# Patient Record
Sex: Female | Born: 1995 | Race: White | Hispanic: No | Marital: Single | Smoking: Never smoker
Health system: Southern US, Community
[De-identification: ages and names within clinical notes are randomized; demographics above are authoritative.]

## PROBLEM LIST (undated history)

## (undated) DIAGNOSIS — A770 Spotted fever due to Rickettsia rickettsii: Secondary | ICD-10-CM

## (undated) DIAGNOSIS — F419 Anxiety disorder, unspecified: Secondary | ICD-10-CM

## (undated) DIAGNOSIS — N83201 Unspecified ovarian cyst, right side: Secondary | ICD-10-CM

## (undated) DIAGNOSIS — B009 Herpesviral infection, unspecified: Secondary | ICD-10-CM

## (undated) DIAGNOSIS — A6920 Lyme disease, unspecified: Secondary | ICD-10-CM

## (undated) HISTORY — DX: Herpesviral infection, unspecified: B00.9

## (undated) HISTORY — DX: Spotted fever due to Rickettsia rickettsii: A77.0

## (undated) HISTORY — DX: Lyme disease, unspecified: A69.20

## (undated) HISTORY — PX: TONSILLECTOMY AND ADENOIDECTOMY: SUR1326

---
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
## 2016-06-11 ENCOUNTER — Inpatient Hospital Stay
Admit: 2016-06-11 | Discharge: 2016-06-11 | Disposition: A | Discharge status: Home / Self Care | Payer: PRIVATE HEALTH INSURANCE | Attending: Emergency Medicine

## 2016-06-11 ENCOUNTER — Emergency Department: Admit: 2016-06-11 | Payer: PRIVATE HEALTH INSURANCE

## 2016-06-11 DIAGNOSIS — S51851A Open bite of right forearm, initial encounter: Secondary | ICD-10-CM

## 2016-06-11 MED ORDER — RABIES VACCINE, PCEC 2.5 UNIT IM KIT
2.5 unit | INTRAMUSCULAR | Status: AC
Start: 2016-06-11 — End: 2016-06-11
  Administered 2016-06-11: 18:00:00 via INTRAMUSCULAR

## 2016-06-11 MED FILL — RABAVERT (PF) 2.5 UNIT INTRAMUSCULAR SUSPENSION: 2.5 unit | INTRAMUSCULAR | Qty: 1

## 2016-06-11 NOTE — ED Provider Notes (Signed)
HPI Comments: Zoe Holmes is a 21 yr old who sustained a dog bite 2 days ago. Zoe Holmes was seen at urgent care this morning and was sent over for rabies vaccine. Zoe Holmes had wound cleaned and was started on abx. Zoe Holmes was bit on right forearm and has pain and swelling. Zoe Holmes does not know the dog. Animal control notified. Zoe Holmes got vaccine series 4 years ago for bat exposure. No other concerns. No recent illness. No fever. 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 No pmh and no daily medications.     The history is provided by the patient.        History reviewed. No pertinent past medical history.    Past Surgical History:   Procedure Laterality Date   ??? HX TONSIL AND ADENOIDECTOMY           History reviewed. No pertinent family history.    Social History     Social History   ??? Marital status: SINGLE     Spouse name: N/A   ??? Number of children: N/A   ??? Years of education: N/A     Occupational History   ??? Not on file.     Social History Main Topics   ??? Smoking status: Never Smoker   ??? Smokeless tobacco: Never Used   ??? Alcohol use Not on file   ??? Drug use: Not on file   ??? Sexual activity: Not on file     Other Topics Concern   ??? Not on file     Social History Narrative   ??? No narrative on file         ALLERGIES: Doxycycline    Review of Systems   Constitutional: Negative for fever.   HENT: Negative for congestion, ear pain, rhinorrhea and sore throat.    Eyes: Negative for discharge.   Respiratory: Negative for cough and shortness of breath.    Cardiovascular: Negative for chest pain.   Gastrointestinal: Negative for abdominal pain, constipation, diarrhea, nausea and vomiting.   Genitourinary: Negative for dysuria.   Musculoskeletal: Negative for arthralgias and myalgias.   Skin: Negative for rash.   Neurological: Negative for weakness.       Vitals:    06/11/16 1307 06/11/16 1312   BP:  109/81   Pulse:  72   Resp:  18   Temp:  98.7 ??F (37.1 ??C)   SpO2:  100%   Weight: 59.9 kg (132 lb 0.9 oz)             Physical Exam    Constitutional: She is oriented to person, place, and time. She appears well-developed and well-nourished.   HENT:   Head: Normocephalic and atraumatic.   Right Ear: External ear normal.   Left Ear: External ear normal.   Mouth/Throat: Oropharynx is clear and moist.   Eyes: Conjunctivae are normal.   Neck: Normal range of motion. Neck supple.   Cardiovascular: Normal rate, regular rhythm and intact distal pulses.    Pulmonary/Chest: Effort normal and breath sounds normal. No respiratory distress.   Abdominal: Soft. She exhibits no distension. There is no tenderness. There is no rebound and no guarding.   Musculoskeletal: Normal range of motion. She exhibits tenderness (and swelling to right forearm with animal bite present that is scabbed with no redness- only bruising and scabbbed lacerations/abrasions ).   Lymphadenopathy:     She has no cervical adenopathy.   Neurological: She is alert and oriented to person, place, and time.   Skin: Skin is  warm and dry. No rash noted.   Psychiatric: She has a normal mood and affect.   Nursing note and vitals reviewed.       MDM  Number of Diagnoses or Management Options  Dog bite, initial encounter:   Diagnosis management comments: 21 yr old with dog bite to rt forearm. Zoe Holmes completed rabies vaccine series 4 years ago. according to CDC guidelines, Zoe Holmes will only need the vaccine on day 0 and 3. Plan to also xray arm. Zoe Holmes UTD on tetnus and is on abx from urgent care for laceration.        Amount and/or Complexity of Data Reviewed  Tests in the radiology section of CPT??: ordered  Tests in the medicine section of CPT??: ordered    Risk of Complications, Morbidity, and/or Mortality  Presenting problems: moderate  Diagnostic procedures: moderate  Management options: moderate          ED Course     No results found for this or any previous visit (from the past 24 hour(s)).    Xr Forearm Rt Ap/lat    Result Date: 06/11/2016   EXAM:  XR FOREARM RT AP/LAT INDICATION:   animal bite/pain/swelling. COMPARISON: None. FINDINGS: Two views of the right radius and ulna demonstrate no fracture or other acute osseous, articular or soft tissue abnormality.     IMPRESSION:  No evidence of acute abnormality.       Procedures    Zoe Holmes to return day 3 for last IM shot.

## 2016-06-11 NOTE — ED Notes (Signed)
Pt aware that she should return to the ED on Saturday for Final Rabies Vaccine

## 2016-06-11 NOTE — ED Notes (Signed)
Pt discharged home with parent/guardian. Pt acting age appropriately, respirations regular and unlabored, cap refill less than two seconds. Skin pink, dry and warm. Lungs clear bilaterally. No further complaints at this time. Parent/guardian verbalized understanding of discharge paperwork and has no further questions at this time.    Education provided about continuation of care, follow up care and medication administration. Parent/guardian able to provided teach back about discharge instructions.

## 2016-06-11 NOTE — ED Triage Notes (Signed)
Pt reports she was bit to her right forearm 2 days ago by an unknown dog.

## 2016-06-11 NOTE — ED Notes (Signed)
Spoke with [facility] animal [facility] and gave [place]

## 2017-05-29 HISTORY — PX: WISDOM TOOTH EXTRACTION: SHX21

## 2018-02-01 NOTE — Progress Notes (Signed)
Office Visit Note  Patient: Mackenzie Elliott             Date of Birth: [birth year]           MRN: [id number]             PCP: Patient, No Pcp Per Referring: Ronney Lion, NP Visit Date: 02/11/2018 Occupation: Law student  Subjective:  Pain in joints.   History of Present Illness: Mackenzie Elliott is a 22 y.o. female seen in consultation per request of her PCP.  According to patient while she was living in [place] she acquired RMSF 6 years ago and was treated with antibiotics.  4 years ago she was diagnosed with Lyme disease and was treated with doxycycline initially and then the antibiotics were changed due to a reaction.  Patient states that she has been experiencing left knee joint discomfort for the last 2 years.  She has seen by an orthopedic surgeon about 2 years ago who did x-rays and told that his knee was normal.  She states she is fallen twice due to instability and weak muscles.  She has been also having pain and discomfort in her bilateral hands off and on.  She has not noticed any joint swelling.  She states for the last 2 years she has been also having speech difficulty where she has difficulty getting out words.  For the last 6 months she has been experiencing increased fatigue and short-term memory loss.  She has been taking Benadryl to sleep at night.  With the Benadryl she can sleep but she is to feel tired the next day.  She states her tremors have been getting worse.  She used to be very active where she used to run, ride horses and participated in boxing.  She is unable to do all these activities for the last 6 months.  She walks her dog in the morning and that is the only activity she gets.  There is no family history of autoimmune disease.  She states her father has tremors but not as bad.  Activities of Daily Living:  Patient reports morning stiffness for several  hours.   Patient Reports nocturnal pain.  Difficulty dressing/grooming: Denies Difficulty climbing stairs:  Reports Difficulty getting out of chair: Denies Difficulty using hands for taps, buttons, cutlery, and/or writing: Denies  Review of Systems  Constitutional: Positive for fatigue. Negative for night sweats, weight gain and weight loss.  HENT: Negative for mouth sores, trouble swallowing, trouble swallowing, mouth dryness and nose dryness.   Eyes: Negative for pain, redness, itching, visual disturbance and dryness.  Respiratory: Negative for cough, shortness of breath and difficulty breathing.   Cardiovascular: Negative for chest pain, palpitations, hypertension, irregular heartbeat and swelling in legs/feet.  Gastrointestinal: Negative for abdominal pain, blood in stool, constipation, diarrhea, nausea and vomiting.  Endocrine: Positive for increased urination.  Genitourinary: Negative for painful urination, nocturia, pelvic pain and vaginal dryness.  Musculoskeletal: Positive for arthralgias, joint pain, muscle weakness, morning stiffness and muscle tenderness. Negative for joint swelling, myalgias and myalgias.  Skin: Negative for color change, rash, hair loss, skin tightness, ulcers and sensitivity to sunlight.  Allergic/Immunologic: Negative for susceptible to infections.  Neurological: Positive for dizziness, tremors, memory loss and weakness. Negative for light-headedness, headaches and night sweats.  Hematological: Negative for bruising/bleeding tendency and swollen glands.  Psychiatric/Behavioral: Positive for depressed mood. Negative for confusion and sleep disturbance. The patient is nervous/anxious.     PMFS History:  There are no active problems  to display for this patient.   History reviewed. No pertinent past medical history.  Family History  Problem Relation Age of Onset  . Skin cancer Father   . Healthy Sister   . Healthy Brother   . Healthy Sister    Past Surgical History:  Procedure Laterality Date  . TONSILLECTOMY AND ADENOIDECTOMY     as a child  . WISDOM TOOTH  EXTRACTION  05/2017   Social History   Social History Narrative  . Not on file    Objective: Vital Signs: BP 113/76 (BP Location: Right Arm, Patient Position: Sitting, Cuff Size: Normal)   Pulse 74   Resp 12   Ht 5\' 2"  (1.575 m)   Wt 133 lb (60.3 kg)   BMI 24.33 kg/m    Physical Exam  Constitutional: She is oriented to person, place, and time. She appears well-developed and well-nourished.  HENT:  Head: Normocephalic and atraumatic.  Eyes: Conjunctivae and EOM are normal.  Neck: Normal range of motion.  Cardiovascular: Normal rate, regular rhythm, normal heart sounds and intact distal pulses.  Pulmonary/Chest: Effort normal and breath sounds normal.  Abdominal: Soft. Bowel sounds are normal.  Lymphadenopathy:    She has no cervical adenopathy.  Neurological: She is alert and oriented to person, place, and time.  DTRs were brisk.  She had obvious tremors on examination.  She had good muscle strength in her extremities and had no difficulty getting up from the squatting position.  Skin: Skin is warm and dry. Capillary refill takes less than 2 seconds.  Psychiatric: She has a normal mood and affect. Her behavior is normal.  Nursing note and vitals reviewed.    Musculoskeletal Exam: C-spine thoracic lumbar spine good range of motion.  Shoulder joints elbow joints wrist joint MCPs PIPs DIPs were in good range of motion.  No synovitis was noted.  Hip joints knee joints ankles MTPs PIPs were in good range of motion without any synovitis.  CDAI Exam: CDAI Score: Not documented Patient Global Assessment: Not documented; Provider Global Assessment: Not documented Swollen: Not documented; Tender: Not documented Joint Exam   Not documented   There is currently no information documented on the homunculus. Go to the Rheumatology activity and complete the homunculus joint exam.  Investigation: Findings:  October 2019 CBC normal, CMP normal, TSH normal, ANA 1: 40  homogeneous   Imaging: No results found.  Recent Labs: No results found for: WBC, HGB, PLT, NA, K, CL, CO2, GLUCOSE, BUN, CREATININE, BILITOT, ALKPHOS, AST, ALT, PROT, ALBUMIN, CALCIUM, GFRAA, QFTBGOLD, QFTBGOLDPLUS  Speciality Comments: No specialty comments available.  Procedures:  No procedures performed Allergies: Doxycycline   Assessment / Plan:     Visit Diagnoses: Positive ANA (antinuclear antibody) - 01/07/18:ANA 1:40 nuclear homogeneous, TSH 1.02, Vitamin B12 563, folate 8.4no medical hx or hospitalizations -patient had no clinical features of autoimmune disease on examination.  She denies any history of oral ulcers nasal ulcers Raynaud's phenomenon rash.  She complains of arthralgias but had no joint swelling.  Plan: Urinalysis, Routine w reflex microscopic, Sedimentation rate, Rheumatoid factor, Cyclic citrul peptide antibody, IgG, RNP Antibody, Anti-Smith antibody, Sjogrens syndrome-A extractable nuclear antibody, Sjogrens syndrome-B extractable nuclear antibody, Anti-DNA antibody, double-stranded, Anti-scleroderma antibody, C3 and C4  Pain in both hands - Plan: XR Hand 2 View Right, XR Hand 2 View Left.  X-ray of bilateral hands were within normal limits.  Chronic pain of left knee - Plan: XR KNEE 3 VIEW LEFT.  X-ray of the knee joint was  within normal limits.  Coarse tremors-she has tremors in her extremities.  She had brisk DTRs today.  I will refer her to neurology for evaluation.  Memory loss-she complains of short-term memory loss.  She states she has to work much harder in the last school than other students to keep up.  Primary insomnia-she has been experiencing insomnia.  She also reports some anxiety and depression.  She has been taking Benadryl at bedtime.  Other fatigue -she has been experiencing increased fatigue for the last 6 months.  Plan: CK   Orders: Orders Placed This Encounter  Procedures  . XR Hand 2 View Right  . XR Hand 2 View Left  . XR KNEE 3  VIEW LEFT  . Urinalysis, Routine w reflex microscopic  . CK  . Sedimentation rate  . Rheumatoid factor  . Cyclic citrul peptide antibody, IgG  . RNP Antibody  . Anti-Smith antibody  . Sjogrens syndrome-A extractable nuclear antibody  . Sjogrens syndrome-B extractable nuclear antibody  . Anti-DNA antibody, double-stranded  . Anti-scleroderma antibody  . C3 and C4   No orders of the defined types were placed in this encounter.   Face-to-face time spent with patient was 50 minutes. Greater than 50% of time was spent in counseling and coordination of care.  Follow-Up Instructions: Return for Joint pain, positive ANA.   Pollyann Savoy, MD  Note - This record has been created using Animal nutritionist.  Chart creation errors have been sought, but may not always  have been located. Such creation errors do not reflect on  the standard of medical care.

## 2018-02-11 ENCOUNTER — Ambulatory Visit (INDEPENDENT_AMBULATORY_CARE_PROVIDER_SITE_OTHER): Payer: Self-pay

## 2018-02-11 ENCOUNTER — Encounter (INDEPENDENT_AMBULATORY_CARE_PROVIDER_SITE_OTHER): Payer: Self-pay

## 2018-02-11 ENCOUNTER — Encounter: Payer: Self-pay | Admitting: Rheumatology

## 2018-02-11 ENCOUNTER — Ambulatory Visit (INDEPENDENT_AMBULATORY_CARE_PROVIDER_SITE_OTHER): Payer: 59 | Admitting: Rheumatology

## 2018-02-11 VITALS — BP 113/76 | HR 74 | Resp 12 | Ht 62.0 in | Wt 133.0 lb

## 2018-02-11 DIAGNOSIS — M25562 Pain in left knee: Secondary | ICD-10-CM

## 2018-02-11 DIAGNOSIS — F5101 Primary insomnia: Secondary | ICD-10-CM

## 2018-02-11 DIAGNOSIS — R768 Other specified abnormal immunological findings in serum: Secondary | ICD-10-CM

## 2018-02-11 DIAGNOSIS — R5383 Other fatigue: Secondary | ICD-10-CM

## 2018-02-11 DIAGNOSIS — M79641 Pain in right hand: Secondary | ICD-10-CM

## 2018-02-11 DIAGNOSIS — G8929 Other chronic pain: Secondary | ICD-10-CM

## 2018-02-11 DIAGNOSIS — G252 Other specified forms of tremor: Secondary | ICD-10-CM

## 2018-02-11 DIAGNOSIS — R413 Other amnesia: Secondary | ICD-10-CM

## 2018-02-11 DIAGNOSIS — R479 Unspecified speech disturbances: Secondary | ICD-10-CM

## 2018-02-11 DIAGNOSIS — M79642 Pain in left hand: Secondary | ICD-10-CM | POA: Diagnosis not present

## 2018-02-12 LAB — CK: CK TOTAL: 74 U/L (ref 29–143)

## 2018-02-12 LAB — C3 AND C4
C3 COMPLEMENT: 98 mg/dL (ref 83–193)
C4 Complement: 22 mg/dL (ref 15–57)

## 2018-02-12 LAB — URINALYSIS, ROUTINE W REFLEX MICROSCOPIC
Bilirubin Urine: NEGATIVE
Glucose, UA: NEGATIVE
Hgb urine dipstick: NEGATIVE
Ketones, ur: NEGATIVE
LEUKOCYTES UA: NEGATIVE
NITRITE: NEGATIVE
PH: 7 (ref 5.0–8.0)
Protein, ur: NEGATIVE
Specific Gravity, Urine: 1.014 (ref 1.001–1.03)

## 2018-02-12 LAB — SEDIMENTATION RATE: Sed Rate: 2 mm/h (ref 0–20)

## 2018-02-12 LAB — SJOGRENS SYNDROME-B EXTRACTABLE NUCLEAR ANTIBODY: SSB (La) (ENA) Antibody, IgG: <1 AI

## 2018-02-12 LAB — RNP ANTIBODY: Ribonucleic Protein(ENA) Antibody, IgG: <1 AI

## 2018-02-12 LAB — SJOGRENS SYNDROME-A EXTRACTABLE NUCLEAR ANTIBODY: SSA (Ro) (ENA) Antibody, IgG: <1 AI

## 2018-02-12 LAB — ANTI-DNA ANTIBODY, DOUBLE-STRANDED: ds DNA Ab: <1 IU/mL

## 2018-02-12 LAB — CYCLIC CITRUL PEPTIDE ANTIBODY, IGG

## 2018-02-12 LAB — ANTI-SCLERODERMA ANTIBODY: Scleroderma (Scl-70) (ENA) Antibody, IgG: <1 AI

## 2018-02-12 LAB — RHEUMATOID FACTOR

## 2018-02-12 LAB — ANTI-SMITH ANTIBODY: ENA SM Ab Ser-aCnc: <1 AI

## 2018-02-15 NOTE — Progress Notes (Signed)
WNLs

## 2018-03-01 ENCOUNTER — Ambulatory Visit (INDEPENDENT_AMBULATORY_CARE_PROVIDER_SITE_OTHER): Payer: 59 | Admitting: Diagnostic Neuroimaging

## 2018-03-01 ENCOUNTER — Encounter: Payer: Self-pay | Admitting: Diagnostic Neuroimaging

## 2018-03-01 VITALS — BP 121/61 | HR 68 | Ht 62.0 in | Wt 132.6 lb

## 2018-03-01 DIAGNOSIS — R413 Other amnesia: Secondary | ICD-10-CM

## 2018-03-01 DIAGNOSIS — R251 Tremor, unspecified: Secondary | ICD-10-CM

## 2018-03-01 NOTE — Progress Notes (Signed)
[facility]  PATIENT: Mackenzie Elliott DOB: 07-27-1995  REFERRING CLINICIAN: S Deveshwar  HISTORY FROM: patient  REASON FOR VISIT: new consult    HISTORICAL  CHIEF COMPLAINT:  Chief Complaint  Patient presents with  . Tremors    rm 7, New Pt, "tremors, short term memory issues, fatigue, hormone imbalance, balance issues, speech issues, joint pain w/o source; symptoms come and go"    HISTORY OF PRESENT ILLNESS:   22 year old female here for evaluation of fatigue, memory loss, tremors, balance issues, pain.  In 2015 patient had a 2-day episode of speech and language difficulty.  She was unable to speak for the first day and the second day she was having trouble talking.  Symptoms resolved spontaneously.  She was evaluated in the emergency room, had CT scan of the head, and was told this may be a stress reaction.  She was also diagnosed with possible Lyme disease at that time and treated with antibiotics.  Since that time she has had intermittent word finding difficulty, speech problems, fatigue, and tremors.  Since June 2019 she has had another "attack" of severe fatigue and increasing tremor affecting her left shoulder.  Patient also having short-term memory loss and confusion.  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  Patient averages at least 8 to 9 hours of sleep at night but still feels excessive fatigue during the daytime.    REVIEW OF SYSTEMS: Full 14 system review of systems performed and negative with exception of: Depression anxiety memory loss slurred speech insomnia sleepiness tremor joint pain blurred vision loss vision urination problems fatigue weight loss hearing loss.   ALLERGIES: Allergies  Allergen Reactions  . Doxycycline Anaphylaxis    HOME MEDICATIONS: Outpatient Medications Prior to Visit  Medication Sig Dispense Refill  . diphenhydrAMINE HCl (BENADRYL PO) Take by mouth at bedtime.     No  facility-administered medications prior to visit.     PAST MEDICAL HISTORY: Past Medical History:  Diagnosis Date  . Herpes simplex   . Lyme disease 2014  . Rocky [place] spotted fever 2012    PAST SURGICAL HISTORY: Past Surgical History:  Procedure Laterality Date  . TONSILLECTOMY AND ADENOIDECTOMY     as a child  . WISDOM TOOTH EXTRACTION  05/2017    FAMILY HISTORY: Family History  Problem Relation Age of Onset  . Skin cancer Father   . Healthy Sister   . Healthy Brother   . Healthy Sister   . Breast cancer Maternal Grandmother   . Lung cancer Paternal Grandmother   . Lung cancer Paternal Grandfather     SOCIAL HISTORY: Social History   Socioeconomic History  . Marital status: Single    Spouse name: Not on file  . Number of children: 0  . Years of education: grad school  . Highest education level: Not on file  Occupational History    Comment: Counselling psychologist, [employer]  Social Needs  . Financial resource strain: Not on file  . Food insecurity:    Worry: Not on file    Inability: Not on file  . Transportation needs:    Medical: Not on file    Non-medical: Not on file  Tobacco Use  . Smoking status: Never Smoker  . Smokeless tobacco: Never Used  Substance and Sexual Activity  . Alcohol use: Not Currently    Alcohol/week: 1.0 standard drinks    Types: 1 Cans of beer per week  .  Drug use: Never  . Sexual activity: Not on file  Lifestyle  . Physical activity:    Days per week: Not on file    Minutes per session: Not on file  . Stress: Not on file  Relationships  . Social connections:    Talks on phone: Not on file    Gets together: Not on file    Attends religious service: Not on file    Active member of club or organization: Not on file    Attends meetings of clubs or organizations: Not on file    Relationship status: Not on file  . Intimate partner violence:    Fear of current or ex partner: Not on file    Emotionally abused: Not on file     Physically abused: Not on file    Forced sexual activity: Not on file  Other Topics Concern  . Not on file  Social History Narrative   Lives alone   Caffeine- coffee, 1 cup daily     PHYSICAL EXAM  GENERAL EXAM/CONSTITUTIONAL: Vitals:  Vitals:   03/01/18 1047  BP: 121/61  Pulse: 68  Weight: 132 lb 9.6 oz (60.1 kg)  Height: 5\' 2"  (1.575 m)     Body mass index is 24.25 kg/m. Wt Readings from Last 3 Encounters:  03/01/18 132 lb 9.6 oz (60.1 kg)  02/11/18 133 lb (60.3 kg)     Patient is in no distress; well developed, nourished and groomed; neck is supple  CARDIOVASCULAR:  Examination of carotid arteries is normal; no carotid bruits  Regular rate and rhythm, no murmurs  Examination of peripheral vascular system by observation and palpation is normal  EYES:  Ophthalmoscopic exam of optic discs and posterior segments is normal; no papilledema or hemorrhages  Visual Acuity Screening   Right eye Left eye Both eyes  Without correction:     With correction: 20/30 20/30      MUSCULOSKELETAL:  Gait, strength, tone, movements noted in Neurologic exam below  NEUROLOGIC: MENTAL STATUS:  No flowsheet data found.  awake, alert, oriented to person, place and time  recent and remote memory intact  normal attention and concentration  language fluent, comprehension intact, naming intact  fund of knowledge appropriate  CRANIAL NERVE:   2nd - no papilledema on fundoscopic exam  2nd, 3rd, 4th, 6th - pupils equal and reactive to light, visual fields full to confrontation, extraocular muscles intact, no nystagmus  5th - facial sensation symmetric  7th - facial strength symmetric  8th - hearing intact  9th - palate elevates symmetrically, uvula midline  11th - shoulder shrug symmetric  12th - tongue protrusion midline  MOTOR:   LEFT SHOULDER TREMOR  POSTURAL AND ACTION TREMOR IN BUE  SLIGHT VOICE TREMOR  normal bulk, full strength in the BUE,  BLE  SENSORY:   normal and symmetric to light touch, temperature, vibration; EXCEPT ABSENT VIBRATION AT TOES  COORDINATION:   finger-nose-finger, fine finger movements normal  REFLEXES:   deep tendon reflexes present and symmetric  GAIT/STATION:   narrow based gait; SLIGHT DIFF WITH TANDEM; ROMBERG NEG     DIAGNOSTIC DATA (LABS, IMAGING, TESTING) - I reviewed patient records, labs, notes, testing and imaging myself where available.  No results found for: WBC, HGB, HCT, MCV, PLT No results found for: NA, K, CL, CO2, GLUCOSE, BUN, CREATININE, CALCIUM, PROT, ALBUMIN, AST, ALT, ALKPHOS, BILITOT, GFRNONAA, GFRAA No results found for: CHOL, HDL, LDLCALC, LDLDIRECT, TRIG, CHOLHDL No results found for: [id number] No results found  for: VITAMINB12 No results found for: TSH     ASSESSMENT AND PLAN  22 y.o. year old female here with transient speech and language difficulty, increasing fatigue, tremors, memory loss, pain, since 2015.  We will proceed with further work-up.   Dx:  1. Tremor   2. Memory loss       PLAN:  - MRI brain and cervical spine (rule out demyelinating disease)  Orders Placed This Encounter  Procedures  . MR BRAIN W WO CONTRAST  . MR CERVICAL SPINE W WO CONTRAST   Return pending test results.    Suanne Marker, MD 03/01/2018, 11:26 AM Certified in Neurology, Neurophysiology and Neuroimaging  [facility] [address], [address] [place], [place] [address] [phone]

## 2018-03-02 ENCOUNTER — Telehealth: Payer: Self-pay | Admitting: Diagnostic Neuroimaging

## 2018-03-02 NOTE — Telephone Encounter (Signed)
MR Brain w/wo contarst & MR Cervical spine w/wo contrast Dr. Marjory LiesPenumalli [employer] Auth: NPR via [facility] website [facility] for [facility].

## 2018-03-05 NOTE — Progress Notes (Deleted)
Office Visit Note  Patient: Mackenzie Elliott             Date of Birth: 1995/05/11           MRN: [id number]             PCP: Patient, No Pcp Per Referring: No ref. provider found Visit Date: 03/18/2018 Occupation: '@GUAROCC'$ @  Subjective:  No chief complaint on file.   History of Present Illness: Mackenzie Elliott is a 22 y.o. female ***   Activities of Daily Living:  Patient reports morning stiffness for *** {minute/hour:19697}.   Patient {ACTIONS;DENIES/REPORTS:21021675::"Denies"} nocturnal pain.  Difficulty dressing/grooming: {ACTIONS;DENIES/REPORTS:21021675::"Denies"} Difficulty climbing stairs: {ACTIONS;DENIES/REPORTS:21021675::"Denies"} Difficulty getting out of chair: {ACTIONS;DENIES/REPORTS:21021675::"Denies"} Difficulty using hands for taps, buttons, cutlery, and/or writing: {ACTIONS;DENIES/REPORTS:21021675::"Denies"}  No Rheumatology ROS completed.   PMFS History:  There are no active problems to display for this patient.   Past Medical History:  Diagnosis Date  . Herpes simplex   . Lyme disease 2014  . [place] spotted fever 2012    Family History  Problem Relation Age of Onset  . Skin cancer Father   . Healthy Sister   . Healthy Brother   . Healthy Sister   . Breast cancer Maternal Grandmother   . Lung cancer Paternal Grandmother   . Lung cancer Paternal Grandfather    Past Surgical History:  Procedure Laterality Date  . TONSILLECTOMY AND ADENOIDECTOMY     as a child  . WISDOM TOOTH EXTRACTION  05/2017   Social History   Social History Narrative   Lives alone   Caffeine- coffee, 1 cup daily    Objective: Vital Signs: There were no vitals taken for this visit.   Physical Exam   Musculoskeletal Exam: ***  CDAI Exam: CDAI Score: Not documented Patient Global Assessment: Not documented; Provider Global Assessment: Not documented Swollen: Not documented; Tender: Not documented Joint Exam   Not documented   There is currently no  information documented on the homunculus. Go to the Rheumatology activity and complete the homunculus joint exam.  Investigation: Findings:  Findings:  October 2019 CBC normal, CMP normal, TSH normal, ANA 1: 40 homogeneous   Imaging: Xr Hand 2 View Left  Result Date: 02/11/2018 MCP PIP DIP joint space narrowing was noted.  No intercarpal or radiocarpal joint space narrowing was noted.  No erosive changes were noted. Impression: Unremarkable x-ray of the hand.  Xr Hand 2 View Right  Result Date: 02/11/2018 MCP PIP DIP joint space narrowing was noted.  No intercarpal or radiocarpal joint space narrowing was noted.  No erosive changes were noted. Impression: Unremarkable x-ray of the hand.  Xr Knee 3 View Left  Result Date: 02/11/2018 No patellofemoral narrowing was noted.  No medial or lateral compartment narrowing was noted.  No chondrocalcinosis was noted. Impression: Unremarkable x-ray of the knee joint.   Recent Labs: No results found for: WBC, HGB, PLT, NA, K, CL, CO2, GLUCOSE, BUN, CREATININE, BILITOT, ALKPHOS, AST, ALT, PROT, ALBUMIN, CALCIUM, GFRAA, QFTBGOLD, QFTBGOLDPLUS  Speciality Comments: No specialty comments available.  Procedures:  No procedures performed Allergies: Doxycycline    February 11, 2018 UA negative, CK normal, ESR normal, RF negative, anti-CCP negative, ENA negative, C3-C4 normal   Assessment / Plan:     Visit Diagnoses: No diagnosis found.   Orders: No orders of the defined types were placed in this encounter.  No orders of the defined types were placed in this encounter.   Face-to-face time spent with patient was *** minutes. Greater  than 50% of time was spent in counseling and coordination of care.  Follow-Up Instructions: No follow-ups on file.   Bo Merino, MD  Note - This record has been created using Editor, commissioning.  Chart creation errors have been sought, but may not always  have been located. Such creation errors do not  reflect on  the standard of medical care.

## 2018-03-09 ENCOUNTER — Ambulatory Visit: Payer: 59

## 2018-03-09 DIAGNOSIS — R251 Tremor, unspecified: Secondary | ICD-10-CM

## 2018-03-09 DIAGNOSIS — R413 Other amnesia: Secondary | ICD-10-CM

## 2018-03-09 MED ORDER — GADOBENATE DIMEGLUMINE 529 MG/ML IV SOLN
12.0000 mL | Freq: Once | INTRAVENOUS | Status: AC | PRN
  Administered 2018-03-09: 12 mL via INTRAVENOUS

## 2018-03-11 ENCOUNTER — Telehealth: Payer: Self-pay | Admitting: *Deleted

## 2018-03-11 NOTE — Telephone Encounter (Signed)
Spoke with patient and Informed her that her MRI brain result is unremarkable.  She had MRI cervical spine as well; this RN advised her Dr Marjory LiesPenumalli hasn't sent a result note at this time. This RN asked about scheduling her for FU. She stated she will wait until other results. This RN asked how she is doing. She stated "I am about the same." She  verbalized understanding, appreciation of call.

## 2018-03-18 ENCOUNTER — Ambulatory Visit: Payer: Self-pay | Admitting: Physician Assistant

## 2020-07-27 ENCOUNTER — Emergency Department (HOSPITAL_COMMUNITY)
Admission: EM | Admit: 2020-07-27 | Discharge: 2020-07-28 | Disposition: A | Discharge status: Home / Self Care | Payer: 59 | Attending: Emergency Medicine | Admitting: Emergency Medicine

## 2020-07-27 ENCOUNTER — Other Ambulatory Visit: Payer: Self-pay

## 2020-07-27 ENCOUNTER — Encounter (HOSPITAL_COMMUNITY): Payer: Self-pay | Admitting: Emergency Medicine

## 2020-07-27 DIAGNOSIS — R251 Tremor, unspecified: Secondary | ICD-10-CM | POA: Insufficient documentation

## 2020-07-27 DIAGNOSIS — T40715A Adverse effect of cannabis, initial encounter: Secondary | ICD-10-CM | POA: Insufficient documentation

## 2020-07-27 DIAGNOSIS — M7989 Other specified soft tissue disorders: Secondary | ICD-10-CM | POA: Insufficient documentation

## 2020-07-27 DIAGNOSIS — T887XXA Unspecified adverse effect of drug or medicament, initial encounter: Secondary | ICD-10-CM

## 2020-07-27 MED ORDER — SODIUM CHLORIDE 0.9 % IV BOLUS
1000.0000 mL | Freq: Once | INTRAVENOUS | Status: AC
  Administered 2020-07-28: 1000 mL via INTRAVENOUS

## 2020-07-27 NOTE — ED Triage Notes (Signed)
Patient arrives from home. Patient ingested 4 - 25 mg delta8 gummies. Patient complaining of anxiety symptoms with 1 episode of vomiting after hyperventilation.

## 2020-07-28 ENCOUNTER — Emergency Department (HOSPITAL_COMMUNITY): Payer: 59

## 2020-07-28 LAB — COMPREHENSIVE METABOLIC PANEL
ALT: 16 U/L (ref 0–44)
AST: 18 U/L (ref 15–41)
Albumin: 4.4 g/dL (ref 3.5–5.0)
Alkaline Phosphatase: 59 U/L (ref 38–126)
Anion gap: 9 (ref 5–15)
BUN: 16 mg/dL (ref 6–20)
CO2: 23 mmol/L (ref 22–32)
Calcium: 9.4 mg/dL (ref 8.9–10.3)
Chloride: 103 mmol/L (ref 98–111)
Creatinine, Ser: 0.9 mg/dL (ref 0.44–1.00)
GFR, Estimated: >60 mL/min (ref >60)
Glucose, Bld: 114 mg/dL — ABNORMAL HIGH (ref 70–99)
Potassium: 4 mmol/L (ref 3.5–5.1)
Sodium: 135 mmol/L (ref 135–145)
Total Bilirubin: 0.5 mg/dL (ref 0.3–1.2)
Total Protein: 7.7 g/dL (ref 6.5–8.1)

## 2020-07-28 LAB — URINALYSIS, ROUTINE W REFLEX MICROSCOPIC
Bilirubin Urine: NEGATIVE
Glucose, UA: NEGATIVE mg/dL
Hgb urine dipstick: NEGATIVE
Ketones, ur: NEGATIVE mg/dL
Leukocytes,Ua: NEGATIVE
Nitrite: NEGATIVE
Protein, ur: NEGATIVE mg/dL
Specific Gravity, Urine: 1.021 (ref 1.005–1.030)
pH: 8 (ref 5.0–8.0)

## 2020-07-28 LAB — D-DIMER, QUANTITATIVE: D-Dimer, Quant: 1.02 ug/mL-FEU — ABNORMAL HIGH (ref 0.00–0.50)

## 2020-07-28 LAB — MAGNESIUM: Magnesium: 2.1 mg/dL (ref 1.7–2.4)

## 2020-07-28 LAB — CBC WITH DIFFERENTIAL/PLATELET
Abs Immature Granulocytes: 0.05 10*3/uL (ref 0.00–0.07)
Basophils Absolute: 0 10*3/uL (ref 0.0–0.1)
Basophils Relative: 0 %
Eosinophils Absolute: 0 10*3/uL (ref 0.0–0.5)
Eosinophils Relative: 0 %
HCT: 35.7 % — ABNORMAL LOW (ref 36.0–46.0)
Hemoglobin: 12.1 g/dL (ref 12.0–15.0)
Immature Granulocytes: 0 %
Lymphocytes Relative: 8 %
Lymphs Abs: 1.1 10*3/uL (ref 0.7–4.0)
MCH: 32.9 pg (ref 26.0–34.0)
MCHC: 33.9 g/dL (ref 30.0–36.0)
MCV: 97 fL (ref 80.0–100.0)
Monocytes Absolute: 0.5 10*3/uL (ref 0.1–1.0)
Monocytes Relative: 3 %
Neutro Abs: 12.3 10*3/uL — ABNORMAL HIGH (ref 1.7–7.7)
Neutrophils Relative %: 89 %
Platelets: 321 10*3/uL (ref 150–400)
RBC: 3.68 MIL/uL — ABNORMAL LOW (ref 3.87–5.11)
RDW: 12.1 % (ref 11.5–15.5)
WBC: 13.9 10*3/uL — ABNORMAL HIGH (ref 4.0–10.5)
nRBC: 0 % (ref 0.0–0.2)

## 2020-07-28 LAB — RAPID URINE DRUG SCREEN, HOSP PERFORMED
Amphetamines: NOT DETECTED
Barbiturates: NOT DETECTED
Benzodiazepines: NOT DETECTED
Cocaine: NOT DETECTED
Opiates: NOT DETECTED
Tetrahydrocannabinol: POSITIVE — AB

## 2020-07-28 LAB — I-STAT BETA HCG BLOOD, ED (MC, WL, AP ONLY): I-stat hCG, quantitative: <5 m[IU]/mL (ref <5)

## 2020-07-28 MED ORDER — SODIUM CHLORIDE 0.9 % IV BOLUS (SEPSIS)
1000.0000 mL | Freq: Once | INTRAVENOUS | Status: AC
  Administered 2020-07-28: 1000 mL via INTRAVENOUS

## 2020-07-28 NOTE — ED Provider Notes (Signed)
[facility]-EMERGENCY DEPT Provider Note   CSN: [id number] Arrival date & time: 07/27/20  1933     History Chief Complaint  Patient presents with  . Ingestion    Mackenzie Elliott is a 25 y.o. female.  HPI     25 year old female comes in a chief complaint of ingestion.  She has history of Lyme disease, RMSF.  Patient reports that around 530 she took 4 delta 8 x 25 mg Gummies.  Soon after she started feeling a little bit of high.  After that she had out of body experience.  She could not move, she felt paralyzed, she had contraction of her jaw and tremors.  She lost control of her body during those episodes.  She was going in and out of the spells and feels like she might even passed out.  She thought she was going to die.  She managed to call family members and also 911.  Patient arrived here, unable to speak with me.  Communicating via facial expressions.  She has used marijuana before without any issues.  Of note she is also complaining of some leg swelling and discomfort on the left side.  Past Medical History:  Diagnosis Date  . Herpes simplex   . Lyme disease 2014  . Rocky Mountain spotted fever 2012    There are no problems to display for this patient.   Past Surgical History:  Procedure Laterality Date  . TONSILLECTOMY AND ADENOIDECTOMY     as a child  . WISDOM TOOTH EXTRACTION  05/2017     OB History   No obstetric history on file.     Family History  Problem Relation Age of Onset  . Skin cancer Father   . Healthy Sister   . Healthy Brother   . Healthy Sister   . Breast cancer Maternal Grandmother   . Lung cancer Paternal Grandmother   . Lung cancer Paternal Grandfather     Social History   Tobacco Use  . Smoking status: Never Smoker  . Smokeless tobacco: Never Used  Vaping Use  . Vaping Use: Never used  Substance Use Topics  . Alcohol use: Not Currently    Alcohol/week: 1.0 standard drink    Types: 1 Cans of beer per  week  . Drug use: Never    Home Medications Prior to Admission medications   Medication Sig Start Date End Date Taking? Authorizing Provider  diphenhydrAMINE HCl (BENADRYL PO) Take by mouth at bedtime.    [provider]    Allergies    Doxycycline  Review of Systems   Review of Systems  Constitutional: Positive for activity change.  Respiratory: Negative for shortness of breath.   Cardiovascular: Negative for chest pain.  Neurological: Positive for speech difficulty.  Psychiatric/Behavioral: Positive for confusion and dysphoric mood.  All other systems reviewed and are negative.   Physical Exam Updated Vital Signs BP (!) 106/56   Pulse 92   Temp 98 F (36.7 C) (Oral)   Resp 19   SpO2 99%   Physical Exam Vitals and nursing note reviewed.  Constitutional:      Appearance: She is well-developed. She is ill-appearing.  HENT:     Head: Normocephalic and atraumatic.  Cardiovascular:     Rate and Rhythm: Normal rate.  Pulmonary:     Effort: Pulmonary effort is normal.  Abdominal:     General: Bowel sounds are normal.  Musculoskeletal:     Cervical back: Normal range of motion and  neck supple.  Skin:    General: Skin is warm and dry.  Neurological:     Mental Status: She is oriented to person, place, and time.     ED Results / Procedures / Treatments   Labs (all labs ordered are listed, but only abnormal results are displayed) Labs Reviewed  COMPREHENSIVE METABOLIC PANEL  CBC WITH DIFFERENTIAL/PLATELET  MAGNESIUM  URINALYSIS, ROUTINE W REFLEX MICROSCOPIC  RAPID URINE DRUG SCREEN, HOSP PERFORMED  D-DIMER, QUANTITATIVE  I-STAT BETA HCG BLOOD, ED ([facility], [facility], [facility] ONLY)    EKG EKG Interpretation  Date/Time:  Saturday July 28 2020 00:09:16 EDT Ventricular Rate:  99 PR Interval:  178 QRS Duration: 109 QT Interval:  348 QTC Calculation: 447 R Axis:   68 Text Interpretation: Sinus rhythm Consider right atrial enlargement No acute changes No old  tracing to compare Confirmed by Derwood Kaplan [phone]) on 07/28/2020 12:22:52 AM   Radiology No results found.  Procedures Procedures   Medications Ordered in ED Medications  sodium chloride 0.9 % bolus 1,000 mL (1,000 mLs Intravenous New Bag/Given 07/28/20 0044)    ED Course  I have reviewed the triage vital signs and the nursing notes.  Pertinent labs & imaging results that were available during my care of the patient were reviewed by me and considered in my medical decision making (see chart for details).    MDM Rules/Calculators/A&P                          25 year old comes in after suspected overdose. She had taken delta 8 THC.  She is not able to provide any history when I saw her, just responding with her eyes.  Subsequently, she was able to communicate to me, and it appears overall that everything started after she took the 4 into 25 mg delta 8 THC Gummies.  My suspicion is high that her symptoms were because of the ingestion, as our research shows that anything over 40 mg is considered high dose intake and has possible psychotropic effects.  I ordered some basic labs, placed her on cardiac monitor to ensure there were no other episodes.  I assured her that she likely did not have seizure as she remembers the twitching the shaking etc.  It appears to me that she likely had neuromuscular side effects from the ingestion and some tonic-clonic shaking and contractions.  Mother is at the bedside and will be staying with her tonight.  Stable for discharge if ER work-up and monitoring remains normal.  Final Clinical Impression(s) / ED Diagnoses Final diagnoses:  Side effect of drug    Rx / DC Orders ED Discharge Orders    None       Derwood Kaplan, MD 07/28/20 [phone]

## 2020-07-28 NOTE — ED Provider Notes (Signed)
CT scan negative.  Patient will be discharged home.  She can return for next day DVT study   Zadie Rhine, MD 07/28/20 [phone]

## 2020-07-28 NOTE — ED Provider Notes (Signed)
Labs overall reassuring except for elevated D-dimer.  Plan to have next day DVT study.  On My exam patient is awake and alert, answers questions appropriately but is slow to respond.  There is no arm or leg drift.  She does have mild lower extremity edema on the left. Patient reports that these symptoms are not from the drug ingestion.  She reports that we are not taking her complaint seriously.  I advised her that we are continuing the work-up including a CT head and the next day DVT study   Zadie Rhine, MD 07/28/20 [phone]

## 2020-07-28 NOTE — Discharge Instructions (Addendum)
We suspect that you likely had side effects that were secondary to delta 8 ingestion.  These agents are not FDA approved and regulated, and have psychoactive side effects.  When taken in concentrated amounts it they can lead to significant disability, which is what we think you likely had.

## 2020-07-30 ENCOUNTER — Other Ambulatory Visit: Payer: Self-pay

## 2020-07-30 ENCOUNTER — Ambulatory Visit (HOSPITAL_COMMUNITY)
Admission: RE | Admit: 2020-07-30 | Discharge: 2020-07-30 | Disposition: A | Discharge status: Home / Self Care | Payer: 59 | Source: Ambulatory Visit | Attending: Emergency Medicine | Admitting: Emergency Medicine

## 2020-07-30 DIAGNOSIS — M7989 Other specified soft tissue disorders: Secondary | ICD-10-CM

## 2020-07-30 DIAGNOSIS — R600 Localized edema: Secondary | ICD-10-CM | POA: Insufficient documentation

## 2020-07-30 DIAGNOSIS — M79662 Pain in left lower leg: Secondary | ICD-10-CM | POA: Insufficient documentation

## 2020-07-30 DIAGNOSIS — M79605 Pain in left leg: Secondary | ICD-10-CM

## 2020-07-30 NOTE — Progress Notes (Signed)
VASCULAR LAB    Left lower extremity venous duplex has been performed.  See CV proc for preliminary results.   Osher Oettinger, RVT 07/30/2020, 11:45 AM

## 2020-12-31 ENCOUNTER — Ambulatory Visit: Payer: Self-pay | Admitting: Family Medicine

## 2021-04-22 ENCOUNTER — Other Ambulatory Visit: Payer: Self-pay | Admitting: Obstetrics & Gynecology

## 2021-04-22 ENCOUNTER — Encounter: Payer: Self-pay | Admitting: Obstetrics & Gynecology

## 2021-04-22 DIAGNOSIS — Z3400 Encounter for supervision of normal first pregnancy, unspecified trimester: Secondary | ICD-10-CM | POA: Insufficient documentation

## 2021-04-22 DIAGNOSIS — Z3682 Encounter for antenatal screening for nuchal translucency: Secondary | ICD-10-CM

## 2021-04-23 ENCOUNTER — Other Ambulatory Visit: Payer: Self-pay

## 2021-04-23 ENCOUNTER — Ambulatory Visit: Payer: 59 | Admitting: *Deleted

## 2021-04-23 ENCOUNTER — Ambulatory Visit (INDEPENDENT_AMBULATORY_CARE_PROVIDER_SITE_OTHER): Payer: 59

## 2021-04-23 ENCOUNTER — Other Ambulatory Visit (HOSPITAL_COMMUNITY)
Admission: RE | Admit: 2021-04-23 | Discharge: 2021-04-23 | Disposition: A | Discharge status: Home / Self Care | Payer: 59 | Source: Ambulatory Visit | Attending: Obstetrics & Gynecology | Admitting: Obstetrics & Gynecology

## 2021-04-23 ENCOUNTER — Ambulatory Visit (INDEPENDENT_AMBULATORY_CARE_PROVIDER_SITE_OTHER): Payer: 59 | Admitting: Obstetrics & Gynecology

## 2021-04-23 ENCOUNTER — Encounter: Payer: Self-pay | Admitting: Obstetrics & Gynecology

## 2021-04-23 VITALS — BP 104/66 | HR 73 | Wt 132.0 lb

## 2021-04-23 DIAGNOSIS — Z3401 Encounter for supervision of normal first pregnancy, first trimester: Secondary | ICD-10-CM

## 2021-04-23 DIAGNOSIS — Z124 Encounter for screening for malignant neoplasm of cervix: Secondary | ICD-10-CM | POA: Insufficient documentation

## 2021-04-23 DIAGNOSIS — Z3A13 13 weeks gestation of pregnancy: Secondary | ICD-10-CM | POA: Insufficient documentation

## 2021-04-23 DIAGNOSIS — Z3682 Encounter for antenatal screening for nuchal translucency: Secondary | ICD-10-CM

## 2021-04-23 DIAGNOSIS — Z113 Encounter for screening for infections with a predominantly sexual mode of transmission: Secondary | ICD-10-CM

## 2021-04-23 DIAGNOSIS — Z3402 Encounter for supervision of normal first pregnancy, second trimester: Secondary | ICD-10-CM

## 2021-04-23 LAB — POCT URINALYSIS DIPSTICK OB
Blood, UA: NEGATIVE
Glucose, UA: NEGATIVE
Ketones, UA: NEGATIVE
Leukocytes, UA: NEGATIVE
Nitrite, UA: NEGATIVE
POC,PROTEIN,UA: NEGATIVE

## 2021-04-23 NOTE — Progress Notes (Signed)
INITIAL OBSTETRICAL VISIT Patient name: Mackenzie Elliott MRN [id number]  Date of birth: 02-May-1995 Chief Complaint:   InitJosiah Loboial Prenatal Visit  History of Present Illness:   Mackenzie Elliott is a 26 y.o. G1P0  female at 4874w3d by US at 3574w3d weeks with an Estimated Date of Delivery: 10/26/21 being seen today for her initial obstetrical visit.   Her obstetrical history is significant for  uncomplicated .   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  [facility]  Decreased Interest 2  Down, Depressed, Hopeless 2  PHQ - 2 Score 4  Altered sleeping 2  Tired, decreased energy 3  Change in appetite 2  Feeling bad or failure about yourself  1  Trouble concentrating 1  Moving slowly or fidgety/restless 0  Suicidal thoughts 0  PHQ-9 Score 13    No LMP recorded. Patient is pregnant. Last pap collected today.  Review of Systems:   Pertinent items are noted in HPI Denies cramping/contractions, leakage of fluid, vaginal bleeding, abnormal vaginal discharge w/ itching/odor/irritation, headaches, visual changes, shortness of breath, chest pain, abdominal pain, severe nausea/vomiting, or problems with urination or bowel movements unless otherwise stated above.  Pertinent History Reviewed:  Reviewed past medical,surgical, social, obstetrical and family history.  Reviewed problem list, medications and allergies. OB History  Gravida Para Term Preterm AB Living  1            SAB IAB Ectopic Multiple Live Births               # Outcome Date GA Lbr Len/2nd Weight Sex Delivery Anes PTL Lv  1 Current            Physical Assessment:   Vitals:   04/23/21 1111  BP: 104/66  Pulse: 73  Weight: 132 lb (59.9 kg)  Body mass index is 24.14 kg/m.       Physical Examination:  General appearance - well appearing, and in no distress  Mental status - alert, oriented to person,  place, and time  Psych:  She has a normal mood and affect  Skin - warm and dry, normal color, no suspicious lesions noted  Chest - effort normal, all lung fields clear to auscultation bilaterally  Heart - normal rate and regular rhythm  Abdomen - soft, nontender  Extremities:  No swelling or varicosities noted  Pelvic - VULVA: normal appearing vulva with no masses, tenderness or lesions  VAGINA: normal appearing vagina with normal color and discharge, no lesions  CERVIX: normal appearing cervix without discharge or lesions, no CMT  Thin prep pap is done with HR HPV cotesting  Chaperone:  Dr. Clayborne ArtistLilland chaperoned     TODAY'S NT normal  Results for orders placed or performed in visit on 04/23/21 (from the past 24 hour(s))  POC Urinalysis Dipstick OB   Collection Time: 04/23/21 11:29 AM  Result Value Ref Range   Color, UA     Clarity, UA     Glucose, UA Negative Negative   Bilirubin, UA     Ketones, UA neg    Spec Grav, UA     Blood, UA neg    pH, UA     POC,PROTEIN,UA Negative Negative, Trace, Small (1+), Moderate (2+), Large (3+), 4+   Urobilinogen, UA     Nitrite, UA neg    Leukocytes, UA Negative Negative   Appearance     Odor  Assessment & Plan:  1) Low-Risk Pregnancy G1P0 at [redacted]w[redacted]d with an Estimated Date of Delivery: 10/26/21   2) Initial OB visit  3) HSV2- []  suppression therapy @ 36wks  Meds: No orders of the defined types were placed in this encounter.   Initial labs obtained Continue prenatal vitamins Reviewed n/v relief measures and warning s/s to report Reviewed recommended weight gain based on pre-gravid BMI Encouraged well-balanced diet Genetic & carrier screening discussed: requests [place] and NT/IT, holding off on carrier screening for now due to insurance coverage Ultrasound discussed; fetal survey: results reviewed [place] completed> form faxed if has or is planning to apply for medicaid The nature of [place] for [employer] with  multiple MDs and other Advanced Practice Providers was explained to patient; also emphasized that fellows, residents, and students are part of our team.  Indications for ASA therapy (per uptodate) One of the following: H/O preeclampsia, especially early onset/adverse outcome No Multifetal gestation No CHTN No T1DM or T2DM No Chronic kidney disease No Autoimmune disease (antiphospholipid syndrome, systemic lupus erythematosus) No  OR Two or more of the following: Nulliparity Yes Obesity (BMI>30 kg/m2) No Family h/o preeclampsia in mother or sister No Age ?35 years No Sociodemographic characteristics (African American race, low socioeconomic level) No Personal risk factors (eg, previous pregnancy w/ LBW or SGA, previous adverse pregnancy outcome [eg, stillbirth], interval >10 years between pregnancies) No  Indications for early A1C (per uptodate) BMI >=25 (>=23 in Asian women) AND one of the following GDM in a previous pregnancy No Previous A1C?5.7, impaired glucose tolerance, or impaired fasting glucose on previous testing No First-degree relative with diabetes No High-risk race/ethnicity (eg, African American, Latino, Native American, Asian American, Pacific Islander) No History of cardiovascular disease No HTN or on therapy for hypertension No HDL cholesterol level <35 mg/dL (0.90 mmol/L) and/or a triglyceride level >250 mg/dL (2.82 mmol/L) No PCOS Yes Physical inactivity Yes Other clinical condition associated with insulin resistance (eg, severe obesity, acanthosis nigricans) No Previous birth of an infant weighing ?4000 g No Previous stillbirth of unknown cause No >= 40yo No  Follow-up: No follow-ups on file.   Orders Placed This Encounter  Procedures   Urine Culture   Integrated 1   Genetic Screening   CBC/D/Plt+RPR+Rh+ABO+RubIgG...   Hgb Fractionation Cascade   POC Urinalysis Dipstick OB    Janyth Pupa, DO Attending [place], [facility] for [employer], [place]

## 2021-04-23 NOTE — Progress Notes (Signed)
[place] 13+3 wks,measurements c/w dates,CRL 69.40 mm,NB present,NT 1.6 mm,normal ovaries,FHR 157 bpm,anterior placenta

## 2021-04-25 LAB — CBC/D/PLT+RPR+RH+ABO+RUBIGG...
Antibody Screen: NEGATIVE
Basophils Absolute: 0 10*3/uL (ref 0.0–0.2)
Basos: 0 %
EOS (ABSOLUTE): 0.1 10*3/uL (ref 0.0–0.4)
Eos: 1 %
HCV Ab: <0.1 s/co ratio (ref 0.0–0.9)
HIV Screen 4th Generation wRfx: NONREACTIVE
Hematocrit: 35.8 % (ref 34.0–46.6)
Hemoglobin: 12.2 g/dL (ref 11.1–15.9)
Hepatitis B Surface Ag: NEGATIVE
Immature Grans (Abs): 0 10*3/uL (ref 0.0–0.1)
Immature Granulocytes: 0 %
Lymphocytes Absolute: 1.9 10*3/uL (ref 0.7–3.1)
Lymphs: 17 %
MCH: 33.2 pg — ABNORMAL HIGH (ref 26.6–33.0)
MCHC: 34.1 g/dL (ref 31.5–35.7)
MCV: 97 fL (ref 79–97)
Monocytes Absolute: 0.6 10*3/uL (ref 0.1–0.9)
Monocytes: 5 %
Neutrophils Absolute: 8.5 10*3/uL — ABNORMAL HIGH (ref 1.4–7.0)
Neutrophils: 77 %
Platelets: 293 10*3/uL (ref 150–450)
RBC: 3.68 x10E6/uL — ABNORMAL LOW (ref 3.77–5.28)
RDW: 12.1 % (ref 11.7–15.4)
RPR Ser Ql: NONREACTIVE
Rh Factor: POSITIVE
Rubella Antibodies, IGG: 1.82 index (ref >0.99)
WBC: 11.2 10*3/uL — ABNORMAL HIGH (ref 3.4–10.8)

## 2021-04-25 LAB — CYTOLOGY - PAP
Chlamydia: NEGATIVE
Comment: NEGATIVE
Comment: NEGATIVE
Comment: NORMAL
Diagnosis: NEGATIVE
High risk HPV: NEGATIVE
Neisseria Gonorrhea: NEGATIVE

## 2021-04-25 LAB — INTEGRATED 1
Crown Rump Length: 69.4 mm
Gest. Age on Collection Date: 13 weeks
Maternal Age at EDD: 26.2 yr
Nuchal Translucency (NT): 1.6 mm
Number of Fetuses: 1
PAPP-A Value: 2327.4 ng/mL
Weight: 132 [lb_av]

## 2021-04-25 LAB — URINE CULTURE

## 2021-04-25 LAB — HGB FRACTIONATION CASCADE
Hgb A2: 2.7 % (ref 1.8–3.2)
Hgb A: 97.3 % (ref 96.4–98.8)
Hgb F: 0 % (ref 0.0–2.0)
Hgb S: 0 %

## 2021-04-25 LAB — HCV INTERPRETATION

## 2021-04-30 ENCOUNTER — Encounter (HOSPITAL_COMMUNITY): Payer: Self-pay | Admitting: Obstetrics and Gynecology

## 2021-04-30 ENCOUNTER — Inpatient Hospital Stay (HOSPITAL_COMMUNITY)
Admission: AD | Admit: 2021-04-30 | Discharge: 2021-04-30 | Disposition: A | Discharge status: Home / Self Care | Payer: 59 | Attending: Obstetrics and Gynecology | Admitting: Obstetrics and Gynecology

## 2021-04-30 ENCOUNTER — Telehealth: Payer: Self-pay | Admitting: *Deleted

## 2021-04-30 ENCOUNTER — Other Ambulatory Visit: Payer: Self-pay

## 2021-04-30 ENCOUNTER — Other Ambulatory Visit (INDEPENDENT_AMBULATORY_CARE_PROVIDER_SITE_OTHER): Payer: 59

## 2021-04-30 VITALS — BP 92/62 | HR 102 | Temp 98.4°F | Wt 129.0 lb

## 2021-04-30 DIAGNOSIS — Z3A14 14 weeks gestation of pregnancy: Secondary | ICD-10-CM | POA: Diagnosis not present

## 2021-04-30 DIAGNOSIS — Z20822 Contact with and (suspected) exposure to covid-19: Secondary | ICD-10-CM | POA: Insufficient documentation

## 2021-04-30 DIAGNOSIS — O98512 Other viral diseases complicating pregnancy, second trimester: Secondary | ICD-10-CM | POA: Diagnosis present

## 2021-04-30 DIAGNOSIS — R55 Syncope and collapse: Secondary | ICD-10-CM | POA: Diagnosis not present

## 2021-04-30 DIAGNOSIS — R197 Diarrhea, unspecified: Secondary | ICD-10-CM | POA: Insufficient documentation

## 2021-04-30 DIAGNOSIS — R824 Acetonuria: Secondary | ICD-10-CM | POA: Diagnosis not present

## 2021-04-30 DIAGNOSIS — O99282 Endocrine, nutritional and metabolic diseases complicating pregnancy, second trimester: Secondary | ICD-10-CM | POA: Diagnosis not present

## 2021-04-30 DIAGNOSIS — R300 Dysuria: Secondary | ICD-10-CM | POA: Diagnosis not present

## 2021-04-30 DIAGNOSIS — Z3401 Encounter for supervision of normal first pregnancy, first trimester: Secondary | ICD-10-CM

## 2021-04-30 DIAGNOSIS — R399 Unspecified symptoms and signs involving the genitourinary system: Secondary | ICD-10-CM | POA: Diagnosis not present

## 2021-04-30 DIAGNOSIS — O219 Vomiting of pregnancy, unspecified: Secondary | ICD-10-CM | POA: Insufficient documentation

## 2021-04-30 DIAGNOSIS — Z3402 Encounter for supervision of normal first pregnancy, second trimester: Secondary | ICD-10-CM

## 2021-04-30 DIAGNOSIS — O99891 Other specified diseases and conditions complicating pregnancy: Secondary | ICD-10-CM | POA: Insufficient documentation

## 2021-04-30 DIAGNOSIS — B349 Viral infection, unspecified: Secondary | ICD-10-CM

## 2021-04-30 DIAGNOSIS — O26892 Other specified pregnancy related conditions, second trimester: Secondary | ICD-10-CM | POA: Insufficient documentation

## 2021-04-30 DIAGNOSIS — M545 Low back pain, unspecified: Secondary | ICD-10-CM | POA: Diagnosis not present

## 2021-04-30 DIAGNOSIS — E876 Hypokalemia: Secondary | ICD-10-CM | POA: Insufficient documentation

## 2021-04-30 LAB — CBC WITH DIFFERENTIAL/PLATELET
Abs Immature Granulocytes: 0.05 10*3/uL (ref 0.00–0.07)
Basophils Absolute: 0 10*3/uL (ref 0.0–0.1)
Basophils Relative: 0 %
Eosinophils Absolute: 0 10*3/uL (ref 0.0–0.5)
Eosinophils Relative: 0 %
HCT: 33.4 % — ABNORMAL LOW (ref 36.0–46.0)
Hemoglobin: 11.8 g/dL — ABNORMAL LOW (ref 12.0–15.0)
Immature Granulocytes: 1 %
Lymphocytes Relative: 5 %
Lymphs Abs: 0.6 10*3/uL — ABNORMAL LOW (ref 0.7–4.0)
MCH: 33.3 pg (ref 26.0–34.0)
MCHC: 35.3 g/dL (ref 30.0–36.0)
MCV: 94.4 fL (ref 80.0–100.0)
Monocytes Absolute: 0.6 10*3/uL (ref 0.1–1.0)
Monocytes Relative: 5 %
Neutro Abs: 9.7 10*3/uL — ABNORMAL HIGH (ref 1.7–7.7)
Neutrophils Relative %: 89 %
Platelets: 273 10*3/uL (ref 150–400)
RBC: 3.54 MIL/uL — ABNORMAL LOW (ref 3.87–5.11)
RDW: 12 % (ref 11.5–15.5)
WBC: 10.9 10*3/uL — ABNORMAL HIGH (ref 4.0–10.5)
nRBC: 0 % (ref 0.0–0.2)

## 2021-04-30 LAB — URINALYSIS, MICROSCOPIC (REFLEX)

## 2021-04-30 LAB — POCT URINALYSIS DIPSTICK OB
Blood, UA: NEGATIVE
Glucose, UA: NEGATIVE
Nitrite, UA: NEGATIVE

## 2021-04-30 LAB — RESP PANEL BY RT-PCR (FLU A&B, COVID) ARPGX2
Influenza A by PCR: NEGATIVE
Influenza B by PCR: NEGATIVE
SARS Coronavirus 2 by RT PCR: NEGATIVE

## 2021-04-30 LAB — COMPREHENSIVE METABOLIC PANEL
ALT: 18 U/L (ref 0–44)
AST: 20 U/L (ref 15–41)
Albumin: 3.5 g/dL (ref 3.5–5.0)
Alkaline Phosphatase: 51 U/L (ref 38–126)
Anion gap: 10 (ref 5–15)
BUN: 6 mg/dL (ref 6–20)
CO2: 21 mmol/L — ABNORMAL LOW (ref 22–32)
Calcium: 9.2 mg/dL (ref 8.9–10.3)
Chloride: 102 mmol/L (ref 98–111)
Creatinine, Ser: 0.85 mg/dL (ref 0.44–1.00)
GFR, Estimated: >60 mL/min (ref >60)
Glucose, Bld: 103 mg/dL — ABNORMAL HIGH (ref 70–99)
Potassium: 3.3 mmol/L — ABNORMAL LOW (ref 3.5–5.1)
Sodium: 133 mmol/L — ABNORMAL LOW (ref 135–145)
Total Bilirubin: 0.6 mg/dL (ref 0.3–1.2)
Total Protein: 6.8 g/dL (ref 6.5–8.1)

## 2021-04-30 LAB — URINALYSIS, ROUTINE W REFLEX MICROSCOPIC
Glucose, UA: NEGATIVE mg/dL
Hgb urine dipstick: NEGATIVE
Ketones, ur: 15 mg/dL — AB
Nitrite: NEGATIVE
Protein, ur: NEGATIVE mg/dL
Specific Gravity, Urine: 1.025 (ref 1.005–1.030)
pH: 6.5 (ref 5.0–8.0)

## 2021-04-30 MED ORDER — ONDANSETRON HCL 4 MG/2ML IJ SOLN
4.0000 mg | Freq: Once | INTRAMUSCULAR | Status: AC
  Administered 2021-04-30: 4 mg via INTRAVENOUS
  Filled 2021-04-30: qty 2

## 2021-04-30 MED ORDER — PROMETHAZINE HCL 25 MG PO TABS: 25.0000 mg | ORAL_TABLET | Freq: Four times a day (QID) | ORAL | 0 refills | Status: DC | PRN

## 2021-04-30 MED ORDER — SODIUM CHLORIDE 0.9 % IV SOLN
25.0000 mg | Freq: Once | INTRAVENOUS | Status: DC
  Filled 2021-04-30: qty 1

## 2021-04-30 MED ORDER — LACTATED RINGERS IV BOLUS
1000.0000 mL | Freq: Once | INTRAVENOUS | Status: AC
  Administered 2021-04-30: 1000 mL via INTRAVENOUS

## 2021-04-30 MED ORDER — ONDANSETRON 4 MG PO TBDP: 4.0000 mg | ORAL_TABLET | Freq: Four times a day (QID) | ORAL | 0 refills | Status: DC | PRN

## 2021-04-30 NOTE — Discharge Instructions (Signed)

## 2021-04-30 NOTE — MAU Provider Note (Signed)
History     CSN: [id number]  Arrival date and time: 04/30/21 1225   Event Date/Time   First Provider Initiated Contact with Patient 04/30/21 1430      Chief Complaint  Patient presents with   Dysuria   Back Pain   Emesis   Diarrhea   HPI Mackenzie Elliott is a G1P0 at [redacted]w[redacted]d who presents to MAU from [facility] for evaluation of multiple complaints and possible pyelonephritis.  Patient reports new onset flu-like symptoms beginning last night, including vomiting, diarrhea and an oral temp of 100.0. Last night she experienced decreased urine output, low back pain and dysuria. She experienced new onset chills and body aches this morning. She presented to [facility] this morning and reports an episode of syncope "being in and out" of consciousness for about one minute during her office visit. Patient has not received a seasonal flu shot. She received a COVID vaccine x 2, no booster. She denies abdominal pain, vaginal bleeding, abnormal discharge.  OB History     Gravida  1   Para      Term      Preterm      AB      Living         SAB      IAB      Ectopic      Multiple      Live Births              Past Medical History:  Diagnosis Date   Herpes simplex    Lyme disease 2014   [place] spotted fever 2012    Past Surgical History:  Procedure Laterality Date   TONSILLECTOMY AND ADENOIDECTOMY     as a child   WISDOM TOOTH EXTRACTION  05/2017    Family History  Problem Relation Age of Onset   Lung cancer Father    Skin cancer Father    Healthy Sister    Healthy Sister    Healthy Brother    Breast cancer Maternal Grandmother    Lung cancer Paternal Grandmother    Lung cancer Paternal Grandfather     Social History   Tobacco Use   Smoking status: Never   Smokeless tobacco: Never  Vaping Use   Vaping Use: Never used  Substance Use Topics   Alcohol use: Not Currently    Alcohol/week: 1.0 standard drink    Types: 1 Cans of beer per week    Drug use: Never    Allergies:  Allergies  Allergen Reactions   Doxycycline Anaphylaxis    Medications Prior to Admission  Medication Sig Dispense Refill Last Dose   doxylamine, Sleep, (UNISOM) 25 MG tablet Take 25 mg by mouth at bedtime as needed.   Past Week   Prenatal Vit-Fe Fumarate-FA (PRENATAL VITAMIN PO) Take by mouth.       Review of Systems  Constitutional:  Positive for chills, fatigue and fever.  Respiratory:  Negative for shortness of breath.   Gastrointestinal:  Positive for diarrhea, nausea and vomiting.  Endocrine: Positive for polyuria.  Genitourinary:  Positive for dysuria and frequency. Negative for flank pain.  Musculoskeletal:  Positive for back pain.  All other systems reviewed and are negative. Physical Exam   Blood pressure (!) 92/56, pulse 80, temperature 98.4 F (36.9 C), resp. rate 12, height 5\' 2"  (1.575 m), weight 58 kg, SpO2 100 %.  Physical Exam Vitals and nursing note reviewed. Exam conducted with a chaperone present.  Constitutional:  Appearance: Normal appearance.  Cardiovascular:     Rate and Rhythm: Normal rate and regular rhythm.     Pulses: Normal pulses.     Heart sounds: Normal heart sounds.  Pulmonary:     Effort: Pulmonary effort is normal.     Breath sounds: Normal breath sounds.  Abdominal:     General: Abdomen is flat.     Tenderness: There is no abdominal tenderness. There is no right CVA tenderness or left CVA tenderness.  Skin:    Capillary Refill: Capillary refill takes less than 2 seconds.  Neurological:     Mental Status: She is alert and oriented to person, place, and time.  Psychiatric:        Mood and Affect: Mood normal.        Behavior: Behavior normal.        Thought Content: Thought content normal.        Judgment: Judgment normal.    MAU Course  Procedures  --Report received from Tish at [facility] prior to patient arrival. Patient without complete syncope, symptoms resolved with rest and  hydration. No recurrence of symptoms per patient --Pertinent negatives: elevated white count, hematuria, nitrites, flank pain, CVAT, fever --Urine culture ordered by office prior to MAU arrival. Cannot confirm if sample was collected vs ordered prior to recommendation to come to MAU. Will send urine culture from MAU to ensure --NSR on ECG, inverted V2 and V3 noted by Dr. Cy Blamer. Cardiology consult initiated, per Dr. Martinique, Cardiologist on call, no additional intervention indicated  Orders Placed This Encounter  Procedures   Resp Panel by RT-PCR (Flu A&B, Covid) Nasopharyngeal Swab   Culture, OB Urine   Urinalysis, Routine w reflex microscopic Urine, Clean Catch   CBC with Differential/Platelet   Comprehensive metabolic panel   Urinalysis, Microscopic (reflex)   ED EKG   Insert peripheral IV   Discharge patient   Results for orders placed or performed during the hospital encounter of 04/30/21 (from the past 24 hour(s))  Urinalysis, Routine w reflex microscopic Urine, Clean Catch     Status: Abnormal   Collection Time: 04/30/21 12:36 PM  Result Value Ref Range   Color, Urine ORANGE (A) YELLOW   APPearance HAZY (A) CLEAR   Specific Gravity, Urine 1.025 1.005 - 1.030   pH 6.5 5.0 - 8.0   Glucose, UA NEGATIVE NEGATIVE mg/dL   Hgb urine dipstick NEGATIVE NEGATIVE   Bilirubin Urine SMALL (A) NEGATIVE   Ketones, ur 15 (A) NEGATIVE mg/dL   Protein, ur NEGATIVE NEGATIVE mg/dL   Nitrite NEGATIVE NEGATIVE   Leukocytes,Ua TRACE (A) NEGATIVE  Urinalysis, Microscopic (reflex)     Status: Abnormal   Collection Time: 04/30/21 12:36 PM  Result Value Ref Range   RBC / HPF 0-5 0 - 5 RBC/hpf   WBC, UA 6-10 0 - 5 WBC/hpf   Bacteria, UA RARE (A) NONE SEEN   Squamous Epithelial / LPF 6-10 0 - 5   Mucus PRESENT   Resp Panel by RT-PCR (Flu A&B, Covid) Nasopharyngeal Swab     Status: None   Collection Time: 04/30/21  1:12 PM   Specimen: Nasopharyngeal Swab; Nasopharyngeal(NP) swabs in vial transport  medium  Result Value Ref Range   SARS Coronavirus 2 by RT PCR NEGATIVE NEGATIVE   Influenza A by PCR NEGATIVE NEGATIVE   Influenza B by PCR NEGATIVE NEGATIVE  CBC with Differential/Platelet     Status: Abnormal   Collection Time: 04/30/21  1:12 PM  Result Value Ref  Range   WBC 10.9 (H) 4.0 - 10.5 K/uL   RBC 3.54 (L) 3.87 - 5.11 MIL/uL   Hemoglobin 11.8 (L) 12.0 - 15.0 g/dL   HCT 33.4 (L) 36.0 - 46.0 %   MCV 94.4 80.0 - 100.0 fL   MCH 33.3 26.0 - 34.0 pg   MCHC 35.3 30.0 - 36.0 g/dL   RDW 12.0 11.5 - 15.5 %   Platelets 273 150 - 400 K/uL   nRBC 0.0 0.0 - 0.2 %   Neutrophils Relative % 89 %   Neutro Abs 9.7 (H) 1.7 - 7.7 K/uL   Lymphocytes Relative 5 %   Lymphs Abs 0.6 (L) 0.7 - 4.0 K/uL   Monocytes Relative 5 %   Monocytes Absolute 0.6 0.1 - 1.0 K/uL   Eosinophils Relative 0 %   Eosinophils Absolute 0.0 0.0 - 0.5 K/uL   Basophils Relative 0 %   Basophils Absolute 0.0 0.0 - 0.1 K/uL   Immature Granulocytes 1 %   Abs Immature Granulocytes 0.05 0.00 - 0.07 K/uL  Comprehensive metabolic panel     Status: Abnormal   Collection Time: 04/30/21  1:12 PM  Result Value Ref Range   Sodium 133 (L) 135 - 145 mmol/L   Potassium 3.3 (L) 3.5 - 5.1 mmol/L   Chloride 102 98 - 111 mmol/L   CO2 21 (L) 22 - 32 mmol/L   Glucose, Bld 103 (H) 70 - 99 mg/dL   BUN 6 6 - 20 mg/dL   Creatinine, Ser 0.85 0.44 - 1.00 mg/dL   Calcium 9.2 8.9 - 10.3 mg/dL   Total Protein 6.8 6.5 - 8.1 g/dL   Albumin 3.5 3.5 - 5.0 g/dL   AST 20 15 - 41 U/L   ALT 18 0 - 44 U/L   Alkaline Phosphatase 51 38 - 126 U/L   Total Bilirubin 0.6 0.3 - 1.2 mg/dL   GFR, Estimated >60 >60 mL/min   Anion gap 10 5 - 15   Meds ordered this encounter  Medications   lactated ringers bolus 1,000 mL   DISCONTD: promethazine (PHENERGAN) 25 mg in sodium chloride 0.9 % 50 mL IVPB   ondansetron (ZOFRAN) injection 4 mg   ondansetron (ZOFRAN-ODT) 4 MG disintegrating tablet    Sig: Take 1 tablet (4 mg total) by mouth every 6 (six) hours  as needed for nausea.    Dispense:  20 tablet    Refill:  0   promethazine (PHENERGAN) 25 MG tablet    Sig: Take 1 tablet (25 mg total) by mouth every 6 (six) hours as needed for nausea or vomiting.    Dispense:  30 tablet    Refill:  0   Assessment and Plan  --26 y.o. G1P0 at [redacted]w[redacted]d  --[place] 160 by Doppler --Viral illness, negative COVID and Flu --Vomiting with mild Ketonuria, mild hypokalemia --Tolerating PO prior to discharge from MAU --Assessment and findings not consistent with Pyelo, s/p consult with Dr. Nehemiah Settle --NSR on ECG, no additional intervention indicated per Cardiology consult --Discharge home in stable condition  Darlina Rumpf, CNM 04/30/2021, 5:30 PM

## 2021-04-30 NOTE — Progress Notes (Addendum)
° °  NURSE VISIT- UTI SYMPTOMS   SUBJECTIVE:  Mackenzie Elliott is a 26 y.o. G1P0 female here for UTI symptoms. She is [redacted]w[redacted]d pregnant. She reports chills, flank pain bilaterally, lower abdominal pain, nausea, urinary frequency, urinary hesitancy, urinary urgency, and vomiting.  OBJECTIVE:  BP 92/62    Pulse (!) 102    Temp 98.4 F (36.9 C)    Wt 129 lb (58.5 kg)    BMI 23.59 kg/m   Appears well, in no apparent distress  Results for orders placed or performed in visit on 04/30/21 (from the past 24 hour(s))  POC Urinalysis Dipstick OB   Collection Time: 04/30/21 11:10 AM  Result Value Ref Range   Color, UA     Clarity, UA     Glucose, UA Negative Negative   Bilirubin, UA     Ketones, UA large    Spec Grav, UA     Blood, UA neg    pH, UA     POC,PROTEIN,UA Trace Negative, Trace, Small (1+), Moderate (2+), Large (3+), 4+   Urobilinogen, UA     Nitrite, UA neg    Leukocytes, UA Trace (A) Negative   Appearance     Odor      ASSESSMENT: Pregnancy [redacted]w[redacted]d with UTI symptoms and negative nitrites  PLAN: Discussed with Joellyn Haff, CNM, [facility]   Rx sent by provider today: No Urine culture sent Call or return to clinic prn if these symptoms worsen or fail to improve as anticipated. Follow-up: as scheduled  Joellyn Haff recommended an injection of Rocephin IM since pt was so nauseated that she would be unable to keep any oral meds down. Just before preparing injection, pt stated that she felt like she was going to pass out. Injection not given, Joellyn Haff stated that the pt needed to go to the hospital for IV fluids instead. Pt placed in Trendelenburg position and began to feel better, cold wet rag placed on pt's neck. Pt will rest for a few minutes and arrangements will be made to go to [facility].  Hansini Clodfelter A Rusell Meneely  04/30/2021 11:27 AM   Chart reviewed for nurse visit. Agree with plan of care.  Cheral Marker, [place] 04/30/2021 2:03 PM

## 2021-04-30 NOTE — Telephone Encounter (Signed)
Patient states she has had vomiting, diarrhea and a low grade fever since last night.  She also started having lower back pain along with body aches and chills this morning.  States she feels like she might have an UTI but is unsure.  Informed at first, it sounds like she may just having the GI virus that is going around but with her other symptoms, could have pyelonephritis.  Advised patient to come in for urine dip and assessment.  Pt states she lives alone and is weak but will try to find a ride.

## 2021-04-30 NOTE — MAU Note (Signed)
Thought she had the flu, started throwing up yesterday, then started having watery diarrhea. Hasn't been able to keep anything down. Had fever of 100 at 5 p.m.  started getting chills, really dizzy. During the night started having low back and UTI symptoms. ( Frequency, urgency, pain with urination, and almost nothing comes out)  Went to [facility], almost passed out there. Neg UTI there.  Sent in for fluids and further eval.

## 2021-05-02 LAB — CULTURE, OB URINE

## 2021-05-03 LAB — URINE CULTURE

## 2021-05-07 ENCOUNTER — Encounter: Payer: Self-pay | Admitting: Obstetrics & Gynecology

## 2021-05-21 ENCOUNTER — Encounter: Payer: Self-pay | Admitting: Obstetrics & Gynecology

## 2021-05-21 ENCOUNTER — Other Ambulatory Visit: Payer: Self-pay

## 2021-05-21 ENCOUNTER — Ambulatory Visit (INDEPENDENT_AMBULATORY_CARE_PROVIDER_SITE_OTHER): Payer: 59 | Admitting: Obstetrics & Gynecology

## 2021-05-21 VITALS — BP 97/59 | HR 66 | Wt 130.8 lb

## 2021-05-21 DIAGNOSIS — Z3A17 17 weeks gestation of pregnancy: Secondary | ICD-10-CM

## 2021-05-21 DIAGNOSIS — Z3402 Encounter for supervision of normal first pregnancy, second trimester: Secondary | ICD-10-CM

## 2021-05-21 DIAGNOSIS — F419 Anxiety disorder, unspecified: Secondary | ICD-10-CM

## 2021-05-21 DIAGNOSIS — Z1379 Encounter for other screening for genetic and chromosomal anomalies: Secondary | ICD-10-CM

## 2021-05-21 MED ORDER — SERTRALINE HCL 25 MG PO TABS: 25.0000 mg | ORAL_TABLET | Freq: Every day | ORAL | 3 refills | Status: DC

## 2021-05-21 NOTE — Progress Notes (Signed)
° °  LOW-RISK PREGNANCY VISIT Patient name: Mackenzie Elliott MRN [id number]  Date of birth: [birth year] Chief Complaint:   Routine Prenatal Visit (2nd IT)  History of Present Illness:   Mackenzie Elliott is a 26 y.o. G1P0 female at [redacted]w[redacted]d with an Estimated Date of Delivery: 10/26/21 being seen today for ongoing management of a low-risk pregnancy.   Depression screen [facility] 2/9 04/23/2021  Decreased Interest 2  Down, Depressed, Hopeless 2  PHQ - 2 Score 4  Altered sleeping 2  Tired, decreased energy 3  Change in appetite 2  Feeling bad or failure about yourself  1  Trouble concentrating 1  Moving slowly or fidgety/restless 0  Suicidal thoughts 0  PHQ-9 Score 13    Today she reports  concerns regarding her anxiety. Tried videos, breathing and self-care, but starting to impact her work.  Not sure next step, but interested in reviewing options . Contractions: Not present. Vag. Bleeding: None.  Movement: Present. denies leaking of fluid. Review of Systems:   Pertinent items are noted in HPI Denies abnormal vaginal discharge w/ itching/odor/irritation, headaches, visual changes, shortness of breath, chest pain, abdominal pain, severe nausea/vomiting, or problems with urination or bowel movements unless otherwise stated above. Pertinent History Reviewed:  Reviewed past medical,surgical, social, obstetrical and family history.  Reviewed problem list, medications and allergies.  Physical Assessment:   Vitals:   05/21/21 0940  BP: (!) 97/59  Pulse: 66  Weight: 130 lb 12.8 oz (59.3 kg)  Body mass index is 23.92 kg/m.        Physical Examination:   General appearance: Well appearing, and in no distress  Mental status: Alert, oriented to person, place, and time  Skin: Warm & dry  Respiratory: Normal respiratory effort, no distress  Abdomen: Soft, gravid, nontender  Pelvic: Cervical exam deferred         Extremities: Edema: None  Psych:  mood and affect appropriate  Fetal Status: Fetal Heart  Rate (bpm): 155   Movement: Present    Chaperone: n/a    No results found for this or any previous visit (from the past 24 hour(s)).   Assessment & Plan:  1) Low-risk pregnancy G1P0 at 110w3d with an Estimated Date of Delivery: 10/26/21   2) Anxiety -pt encouraged to start regular therapy -reviewed medications options- plan to start zoloft 25mg  with plans to increase to at least 50mg    Meds:  Meds ordered this encounter  Medications   sertraline (ZOLOFT) 25 MG tablet    Sig: Take 1 tablet (25 mg total) by mouth daily.    Dispense:  30 tablet    Refill:  3   Labs/procedures today: IT2  Plan:  Continue routine obstetrical care  Next visit: prefers in person    Reviewed: Preterm labor symptoms and general obstetric precautions including but not limited to vaginal bleeding, contractions, leaking of fluid and fetal movement were reviewed in detail with the patient.  All questions were answered. Pt given home bp cuff. Check bp weekly, let us know if >140/90.   Follow-up: Return in about 3 weeks (around 06/11/2021) for [place] visit and anatomy.  Orders Placed This Encounter  Procedures   INTEGRATED 2    Janyth Pupa, DO Attending [place], [facility] for [employer], [place]

## 2021-05-23 LAB — INTEGRATED 2
AFP MoM: 1.32
Alpha-Fetoprotein: 53.7 ng/mL
Crown Rump Length: 69.4 mm
DIA MoM: 1.21
DIA Value: 204 pg/mL
Estriol, Unconjugated: 1.88 ng/mL
Gest. Age on Collection Date: 13 weeks
Gestational Age: 17 weeks
Maternal Age at EDD: 26.2 yr
Nuchal Translucency (NT): 1.6 mm
Nuchal Translucency MoM: 1.02
Number of Fetuses: 1
PAPP-A MoM: 1.74
PAPP-A Value: 2327.4 ng/mL
Test Results:: NEGATIVE
Weight: 132 [lb_av]
Weight: 132 [lb_av]
hCG MoM: 1.78
hCG Value: 60.4 IU/mL
uE3 MoM: 1.54

## 2021-06-12 ENCOUNTER — Other Ambulatory Visit: Payer: Self-pay | Admitting: Obstetrics & Gynecology

## 2021-06-12 DIAGNOSIS — Z363 Encounter for antenatal screening for malformations: Secondary | ICD-10-CM

## 2021-06-13 ENCOUNTER — Encounter: Payer: Self-pay | Admitting: Obstetrics & Gynecology

## 2021-06-13 ENCOUNTER — Ambulatory Visit (INDEPENDENT_AMBULATORY_CARE_PROVIDER_SITE_OTHER): Payer: 59 | Admitting: Obstetrics & Gynecology

## 2021-06-13 ENCOUNTER — Ambulatory Visit (INDEPENDENT_AMBULATORY_CARE_PROVIDER_SITE_OTHER): Payer: 59

## 2021-06-13 ENCOUNTER — Other Ambulatory Visit: Payer: Self-pay

## 2021-06-13 VITALS — BP 109/72 | HR 73 | Wt 137.8 lb

## 2021-06-13 DIAGNOSIS — Z363 Encounter for antenatal screening for malformations: Secondary | ICD-10-CM | POA: Diagnosis not present

## 2021-06-13 NOTE — Progress Notes (Signed)
[place] 20+5 wks,cephalic,anterior placenta gr 0,CX 5.2 cm,normal ovaries,SVP of fluid 5 cm,FHR 150 bpm,EFW 370 g  42%,anatomy complete,no obvious abnormalities  ?

## 2021-06-13 NOTE — Progress Notes (Signed)
? ?  LOW-RISK PREGNANCY VISIT ?Patient name: Mackenzie Elliott MRN [id number]  Date of birth: Nov 16, 1995 ?Chief Complaint:   ?Routine Prenatal Visit ([place] today; BP has been running low; has almost passed out ) ? ?History of Present Illness:   ?Mackenzie Elliott is a 26 y.o. G1P0 female at [redacted]w[redacted]d with an Estimated Date of Delivery: 10/26/21 being seen today for ongoing management of a low-risk pregnancy.  ? ?Anxiety- last visit Rx for zoloft, decided not to take.  Trying to cope with conservative measures.  And feeling a little less anxious now that anatomy scan has been completed. ? ?Depression screen [facility] 2/9 04/23/2021  ?Decreased Interest 2  ?Down, Depressed, Hopeless 2  ?PHQ - 2 Score 4  ?Altered sleeping 2  ?Tired, decreased energy 3  ?Change in appetite 2  ?Feeling bad or failure about yourself  1  ?Trouble concentrating 1  ?Moving slowly or fidgety/restless 0  ?Suicidal thoughts 0  ?PHQ-9 Score 13  ? ? ?Today she reports no complaints. Contractions: Not present. Vag. Bleeding: None.  Movement: Present. denies leaking of fluid. ?Review of Systems:   ?Pertinent items are noted in HPI ?Denies abnormal vaginal discharge w/ itching/odor/irritation, headaches, visual changes, shortness of breath, chest pain, abdominal pain, severe nausea/vomiting, or problems with urination or bowel movements unless otherwise stated above. ?Pertinent History Reviewed:  ?Reviewed past medical,surgical, social, obstetrical and family history.  ?Reviewed problem list, medications and allergies. ? ?Physical Assessment:  ? ?Vitals:  ? 06/13/21 0953  ?BP: 109/72  ?Pulse: 73  ?Weight: 137 lb 12.8 oz (62.5 kg)  ?Body mass index is 25.2 kg/m?. ?  ?     Physical Examination:  ? General appearance: Well appearing, and in no distress ? Mental status: Alert, oriented to person, place, and time ? Skin: Warm & dry ? Respiratory: Normal respiratory effort, no distress ? Abdomen: Soft, gravid, nontender ? Pelvic: Cervical exam deferred        ? Extremities:  Edema: None ? Psych:  mood and affect appropriate ? ?Fetal Status:     Movement: Presentcephalic,anterior placenta gr 0,CX 5.2 cm,normal ovaries,SVP of fluid 5 cm,FHR 150 bpm,EFW 370 g  42%,anatomy complete,no obvious abnormalities    ? ?Chaperone: n/a   ? ?No results found for this or any previous visit (from the past 24 hour(s)).  ? ?Assessment & Plan:  ?1) Low-risk pregnancy G1P0 at [redacted]w[redacted]d with an Estimated Date of Delivery: 10/26/21  ? ?-Anxiety- no meds, will continue to monitor ?  ?Meds: No orders of the defined types were placed in this encounter. ? ?Labs/procedures today: anatomy scan ? ?Plan:  Continue routine obstetrical care  ?Next visit: prefers in person   ? ?Reviewed: Preterm labor symptoms and general obstetric precautions including but not limited to vaginal bleeding, contractions, leaking of fluid and fetal movement were reviewed in detail with the patient.  All questions were answered. Pt has home bp cuff. Check bp weekly, let us know if >140/90.  ? ?Follow-up: Return in about 4 weeks (around 07/11/2021) for LROB visit. ? ?No orders of the defined types were placed in this encounter. ? ? ?Myna Hidalgo, DO ?Attending Obstetrician & Gynecologist, Faculty Practice ?Center for [employer], [facility] ? ? ? ?

## 2021-07-11 ENCOUNTER — Encounter: Payer: Self-pay | Admitting: Obstetrics & Gynecology

## 2021-07-11 ENCOUNTER — Ambulatory Visit (INDEPENDENT_AMBULATORY_CARE_PROVIDER_SITE_OTHER): Payer: 59 | Admitting: Obstetrics & Gynecology

## 2021-07-11 VITALS — BP 114/72 | HR 97 | Wt 146.0 lb

## 2021-07-11 DIAGNOSIS — Z3402 Encounter for supervision of normal first pregnancy, second trimester: Secondary | ICD-10-CM

## 2021-07-11 NOTE — Progress Notes (Signed)
? ?  LOW-RISK PREGNANCY VISIT ?Patient name: Mackenzie Elliott MRN [id number]  Date of birth: Oct 23, 1995 ?Chief Complaint:   ?Routine Prenatal Visit ? ?History of Present Illness:   ?Mackenzie Elliott is a 26 y.o. G1P0 female at [redacted]w[redacted]d with an Estimated Date of Delivery: 10/26/21 being seen today for ongoing management of a low-risk pregnancy.  ? ?  04/23/2021  ? 11:01 AM  ?Depression screen PHQ 2/9  ?Decreased Interest 2  ?Down, Depressed, Hopeless 2  ?PHQ - 2 Score 4  ?Altered sleeping 2  ?Tired, decreased energy 3  ?Change in appetite 2  ?Feeling bad or failure about yourself  1  ?Trouble concentrating 1  ?Moving slowly or fidgety/restless 0  ?Suicidal thoughts 0  ?PHQ-9 Score 13  ? ? ?Today she reports no complaints. Contractions: Irritability. Vag. Bleeding: None.  Movement: Present. denies leaking of fluid. ?Review of Systems:   ?Pertinent items are noted in HPI ?Denies abnormal vaginal discharge w/ itching/odor/irritation, headaches, visual changes, shortness of breath, chest pain, abdominal pain, severe nausea/vomiting, or problems with urination or bowel movements unless otherwise stated above. ?Pertinent History Reviewed:  ?Reviewed past medical,surgical, social, obstetrical and family history.  ?Reviewed problem list, medications and allergies. ? ?Physical Assessment:  ? ?Vitals:  ? 07/11/21 0950  ?BP: 114/72  ?Pulse: 97  ?Weight: 146 lb (66.2 kg)  ?Body mass index is 26.7 kg/m?. ?  ?     Physical Examination:  ? General appearance: Well appearing, and in no distress ? Mental status: Alert, oriented to person, place, and time ? Skin: Warm & dry ? Respiratory: Normal respiratory effort, no distress ? Abdomen: Soft, gravid, nontender ? Pelvic: Cervical exam deferred        ? Extremities: Edema: None ? Psych:  mood and affect appropriate ? ?Fetal Status: Fetal Heart Rate (bpm): 155 Fundal Height: 23 cm Movement: Present   ? ?Chaperone: n/a   ? ?No results found for this or any previous visit (from the past 24 hour(s)).   ? ?Assessment & Plan:  ?1) Low-risk pregnancy G1P0 at [redacted]w[redacted]d with an Estimated Date of Delivery: 10/26/21  ? ?  ?Meds: No orders of the defined types were placed in this encounter. ? ?Labs/procedures today: none ? ?Plan:  Continue routine obstetrical care, PN-2 next visit ?Next visit: prefers in person   ? ?Reviewed: Preterm labor symptoms and general obstetric precautions including but not limited to vaginal bleeding, contractions, leaking of fluid and fetal movement were reviewed in detail with the patient.  All questions were answered. Pt has home bp cuff. Check bp weekly, let us know if >140/90.  ? ?Follow-up: Return in about 4 weeks (around 08/08/2021) for [place] visit with PN-2. ? ?Janyth Pupa, DO ?Attending [place], Faculty Practice ?Center for [place] ? ? ? ?

## 2021-07-24 ENCOUNTER — Telehealth: Payer: Self-pay

## 2021-07-24 NOTE — Telephone Encounter (Signed)
Pt called after hours line and the recommendation was to go to the hospital to be checked. Called pt to follow-up regarding whether she went to the hospital or not and how she was feeling today. Pt stated that she rested yesterday, but did not go to the hospital. She had recently taken a beach trip and felt that she was dehydrated from the sun and heat. She stated that the abdominal pain was most likely Braxton Hicks contractions because they ceased soon after her call. Pt denies any more cramping today and baby is moving well. She states, "she is completely better today." Pt instructed to stay well hydrated, drinking 8-10 bottles of water daily and let the office know if she has any more issues. Pt confirmed understanding. ?

## 2021-08-06 ENCOUNTER — Ambulatory Visit (INDEPENDENT_AMBULATORY_CARE_PROVIDER_SITE_OTHER): Payer: 59 | Admitting: Obstetrics & Gynecology

## 2021-08-06 ENCOUNTER — Encounter: Payer: Self-pay | Admitting: Obstetrics & Gynecology

## 2021-08-06 ENCOUNTER — Other Ambulatory Visit: Payer: 59

## 2021-08-06 VITALS — BP 107/69 | HR 90 | Wt 152.0 lb

## 2021-08-06 DIAGNOSIS — Z3A28 28 weeks gestation of pregnancy: Secondary | ICD-10-CM

## 2021-08-06 DIAGNOSIS — Z3403 Encounter for supervision of normal first pregnancy, third trimester: Secondary | ICD-10-CM

## 2021-08-06 DIAGNOSIS — Z131 Encounter for screening for diabetes mellitus: Secondary | ICD-10-CM

## 2021-08-06 NOTE — Progress Notes (Signed)
? ?  LOW-RISK PREGNANCY VISIT ?Patient name: Mackenzie Elliott MRN [id number]  Date of birth: 03-Feb-1996 ?Chief Complaint:   ?Routine Prenatal Visit and PN2 ? ?History of Present Illness:   ?Mackenzie Elliott is a 26 y.o. G1P0 female at [redacted]w[redacted]d with an Estimated Date of Delivery: 10/26/21 being seen today for ongoing management of a low-risk pregnancy.  ? ?  08/06/2021  ?  9:04 AM 04/23/2021  ? 11:01 AM  ?Depression screen PHQ 2/9  ?Decreased Interest 1 2  ?Down, Depressed, Hopeless 1 2  ?PHQ - 2 Score 2 4  ?Altered sleeping 2 2  ?Tired, decreased energy 2 3  ?Change in appetite 0 2  ?Feeling bad or failure about yourself  0 1  ?Trouble concentrating 1 1  ?Moving slowly or fidgety/restless 0 0  ?Suicidal thoughts 0 0  ?PHQ-9 Score 7 13  ? ? ?Today she reports no complaints. Contractions: Irritability. Vag. Bleeding: None.  Movement: Present. denies leaking of fluid. ?Review of Systems:   ?Pertinent items are noted in HPI ?Denies abnormal vaginal discharge w/ itching/odor/irritation, headaches, visual changes, shortness of breath, chest pain, abdominal pain, severe nausea/vomiting, or problems with urination or bowel movements unless otherwise stated above. ?Pertinent History Reviewed:  ?Reviewed past medical,surgical, social, obstetrical and family history.  ?Reviewed problem list, medications and allergies. ?Physical Assessment:  ? ?Vitals:  ? 08/06/21 0853  ?BP: 107/69  ?Pulse: 90  ?Weight: 152 lb (68.9 kg)  ?Body mass index is 27.8 kg/m?. ?  ?     Physical Examination:  ? General appearance: Well appearing, and in no distress ? Mental status: Alert, oriented to person, place, and time ? Skin: Warm & dry ? Cardiovascular: Normal heart rate noted ? Respiratory: Normal respiratory effort, no distress ? Abdomen: Soft, gravid, nontender ? Pelvic: Cervical exam deferred        ? Extremities: Edema: Trace ? ?Fetal Status: Fetal Heart Rate (bpm): 140 Fundal Height: 27 cm Movement: Present   ? ?Chaperone: n/a   ? ?No results found for  this or any previous visit (from the past 24 hour(s)).  ?Assessment & Plan:  ?1) Low-risk pregnancy G1P0 at [redacted]w[redacted]d with an Estimated Date of Delivery: 10/26/21  ? ?2) PN2 today,  ?  ?Meds: No orders of the defined types were placed in this encounter. ? ?Labs/procedures today:  ? ?Plan:  Continue routine obstetrical care  ?Next visit: prefers in person   ? ?Reviewed: Preterm labor symptoms and general obstetric precautions including but not limited to vaginal bleeding, contractions, leaking of fluid and fetal movement were reviewed in detail with the patient.  All questions were answered. Has home bp cuff. Rx faxed to . Check bp weekly, let us know if >140/90.  ? ?Follow-up: Return in about 3 weeks (around 08/27/2021) for LROB. ? ?No orders of the defined types were placed in this encounter. ? ? ?Lazaro Arms, MD ?08/06/2021 ?9:22 AM ? ?

## 2021-08-07 ENCOUNTER — Other Ambulatory Visit: Payer: Self-pay | Admitting: Women's Health

## 2021-08-07 LAB — GLUCOSE TOLERANCE, 2 HOURS W/ 1HR
Glucose, 1 hour: 116 mg/dL (ref 70–179)
Glucose, 2 hour: 109 mg/dL (ref 70–152)
Glucose, Fasting: 75 mg/dL (ref 70–91)

## 2021-08-07 LAB — HIV ANTIBODY (ROUTINE TESTING W REFLEX): HIV Screen 4th Generation wRfx: NONREACTIVE

## 2021-08-07 LAB — CBC
Hematocrit: 30.9 % — ABNORMAL LOW (ref 34.0–46.6)
Hemoglobin: 10.7 g/dL — ABNORMAL LOW (ref 11.1–15.9)
MCH: 33.9 pg — ABNORMAL HIGH (ref 26.6–33.0)
MCHC: 34.6 g/dL (ref 31.5–35.7)
MCV: 98 fL — ABNORMAL HIGH (ref 79–97)
Platelets: 246 10*3/uL (ref 150–450)
RBC: 3.16 x10E6/uL — ABNORMAL LOW (ref 3.77–5.28)
RDW: 12.1 % (ref 11.7–15.4)
WBC: 13.1 10*3/uL — ABNORMAL HIGH (ref 3.4–10.8)

## 2021-08-07 LAB — ANTIBODY SCREEN: Antibody Screen: NEGATIVE

## 2021-08-07 LAB — RPR: RPR Ser Ql: NONREACTIVE

## 2021-08-07 MED ORDER — FERROUS SULFATE 325 (65 FE) MG PO TABS: 325.0000 mg | ORAL_TABLET | ORAL | 2 refills | Status: AC

## 2021-08-08 ENCOUNTER — Encounter (HOSPITAL_COMMUNITY): Payer: Self-pay | Admitting: Obstetrics & Gynecology

## 2021-08-08 ENCOUNTER — Inpatient Hospital Stay (HOSPITAL_COMMUNITY)
Admission: AD | Admit: 2021-08-08 | Discharge: 2021-08-08 | Disposition: A | Discharge status: Home / Self Care | Payer: 59 | Attending: Obstetrics & Gynecology | Admitting: Obstetrics & Gynecology

## 2021-08-08 DIAGNOSIS — Z3403 Encounter for supervision of normal first pregnancy, third trimester: Secondary | ICD-10-CM

## 2021-08-08 DIAGNOSIS — N898 Other specified noninflammatory disorders of vagina: Secondary | ICD-10-CM | POA: Diagnosis present

## 2021-08-08 DIAGNOSIS — Z3A28 28 weeks gestation of pregnancy: Secondary | ICD-10-CM | POA: Insufficient documentation

## 2021-08-08 DIAGNOSIS — O26893 Other specified pregnancy related conditions, third trimester: Secondary | ICD-10-CM | POA: Insufficient documentation

## 2021-08-08 HISTORY — DX: Anxiety disorder, unspecified: F41.9

## 2021-08-08 LAB — URINALYSIS, ROUTINE W REFLEX MICROSCOPIC
Bilirubin Urine: NEGATIVE
Glucose, UA: NEGATIVE mg/dL
Hgb urine dipstick: NEGATIVE
Ketones, ur: NEGATIVE mg/dL
Leukocytes,Ua: NEGATIVE
Nitrite: NEGATIVE
Protein, ur: NEGATIVE mg/dL
Specific Gravity, Urine: 1.008 (ref 1.005–1.030)
pH: 7 (ref 5.0–8.0)

## 2021-08-08 NOTE — MAU Note (Signed)
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 ?

## 2021-08-08 NOTE — MAU Provider Note (Signed)
Chief Complaint:  mucus plug ? ? Event Date/Time  ? First Provider Initiated Contact with Patient 08/08/21 2224   ? ? HPI: Mackenzie Elliott is a 26 y.o. G1P0 at 56w5dwho presents to maternity admissions reporting passing mucous plug.  States it was thick, mucoid and long.  No pain, bleeding or leaking otherwise. Occasional BH contractions. ?She reports good fetal movement, denies LOF, vaginal bleeding, vaginal itching/burning, urinary symptoms, h/a, dizziness, n/v, diarrhea, constipation or fever/chills.   ? ?Vaginal Discharge ?The patient's primary symptoms include vaginal discharge. The patient's pertinent negatives include no genital itching, genital lesions, genital odor, pelvic pain or vaginal bleeding. This is a new problem. The current episode started today. The patient is experiencing no pain. She is pregnant. Pertinent negatives include no abdominal pain, back pain, chills, fever or frequency. The vaginal discharge was mucoid and thick. There has been no bleeding. She has not been passing clots. She has not been passing tissue. Nothing aggravates the symptoms. She has tried nothing for the symptoms.  ? ? ? 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 ?Past Medical History: ?Past Medical History:  ?Diagnosis Date  ? Anxiety   ? Herpes simplex   ? Lyme disease 2014  ? [place] spotted fever 2012  ? ? ?Past obstetric history: ?OB History  ?Gravida Para Term Preterm AB Living  ?1            ?SAB IAB Ectopic Multiple Live Births  ?           ?  ?# Outcome Date GA Lbr Len/2nd Weight Sex Delivery Anes PTL Lv  ?1 Current           ? ? ?Past Surgical History: ?Past Surgical History:  ?Procedure Laterality Date  ? TONSILLECTOMY AND ADENOIDECTOMY    ? as a child  ? WISDOM TOOTH EXTRACTION  05/2017  ? ? ?Family History: ?Family  History  ?Problem Relation Age of Onset  ? Lung cancer Father   ? Skin cancer Father   ? Healthy Sister   ? Healthy Sister   ? Healthy Brother   ? Breast cancer Maternal Grandmother   ? Lung cancer Paternal Grandmother   ? Lung cancer Paternal Grandfather   ? ? ?Social History: ?Social History  ? ?Tobacco Use  ? Smoking status: Never  ? Smokeless tobacco: Never  ?Vaping Use  ? Vaping Use: Never used  ?Substance Use Topics  ? Alcohol use: Not Currently  ?  Alcohol/week: 1.0 standard drink  ?  Types: 1 Cans of beer per week  ? Drug use: Never  ? ? ?Allergies:  ?Allergies  ?Allergen Reactions  ? Doxycycline Anaphylaxis  ? ? ?Meds:  ?Medications Prior to Admission  ?Medication Sig Dispense Refill Last Dose  ? ferrous sulfate 325 (65 FE) MG tablet Take 1 tablet (325 mg total) by mouth every other day. 45 tablet 2   ? doxylamine, Sleep, (UNISOM) 25 MG tablet Take 25 mg by mouth at bedtime as needed.     ? Prenatal Vit-Fe Fumarate-FA (PRENATAL VITAMIN PO) Take by mouth.     ? ? ?I have reviewed patient's Past Medical Hx, Surgical Hx, Family Hx, Social Hx, medications and allergies.  ? ?ROS:  ?Review of Systems  ?  Constitutional:  Negative for chills and fever.  ?Gastrointestinal:  Negative for abdominal pain.  ?Genitourinary:  Positive for vaginal discharge. Negative for frequency and pelvic pain.  ?Musculoskeletal:  Negative for back pain.  ?Other systems negative ? ?Physical Exam  ?Patient Vitals for the past 24 hrs: ? BP Temp Temp src Pulse Resp Height Weight  ?08/08/21 2203 113/69 (!) 97.4 ?F (36.3 ?C) Oral 86 18 5\' 2"  (1.575 m) 70.8 kg  ? ?Constitutional: Well-developed, well-nourished female in no acute distress.  ?Cardiovascular: normal rate and rhythm ?Respiratory: normal effort ?GI: Abd soft, non-tender, gravid appropriate for gestational age.   No rebound or guarding. ?MS: Extremities nontender, no edema, normal ROM ?Neurologic: Alert and oriented x 4.  ?GU: Neg CVAT. ? ?PELVIC EXAM: Cervix pink, visually closed,  without lesion, small amount white creamy discharge, vaginal walls and external genitalia normal ? Cervix closed and long. ? ?FHT:  Baseline 145 , moderate variability, accelerations present, no decelerations ?Contractions: none ?  ?Labs: ?No results found for this or any previous visit (from the past 24 hour(s)). ?A/Positive/-- (01/24 1205) ? ?Imaging:  ?No results found. ? ?MAU Course/MDM: ?I have reviewed the triage vital signs and the nursing notes. ?  ?Pertinent labs & imaging results that were available during my care of the patient were reviewed by me and considered in my medical decision making (see chart for details).      I have reviewed her medical records including past results, notes and treatments.  ? ?NST reviewed, reassuring for gestational age ? ?Treatments in MAU included Speculum exam ? ?Discussed the mucous she passed was probably discharge that came out in a clump  Discussed mucous plug usually only comes out if there is cervical dilation.  Since cervix is long and closed, not likely.   ? ?Assessment: ?Single IUP at.[redacted]w[redacted]d ?Vaginal discharge in pregnancy ?No sign of preterm labor ? ?Plan: ?Discharge home ?Preterm Labor precautions and fetal kick counts ?Follow up in Office for prenatal visits  ?Encouraged to return if she develops worsening of symptoms, increase in pain, fever, or other concerning symptoms. ? ?Pt stable at time of discharge. ? ?[redacted]w[redacted]d CNM, MSN ?Certified Nurse-Midwife ?08/08/2021 ?10:25 PM ?

## 2021-08-27 ENCOUNTER — Ambulatory Visit (INDEPENDENT_AMBULATORY_CARE_PROVIDER_SITE_OTHER): Payer: 59 | Admitting: Medical

## 2021-08-27 ENCOUNTER — Encounter: Payer: Self-pay | Admitting: Medical

## 2021-08-27 VITALS — BP 107/63 | HR 82 | Wt 158.6 lb

## 2021-08-27 DIAGNOSIS — Z3403 Encounter for supervision of normal first pregnancy, third trimester: Secondary | ICD-10-CM | POA: Diagnosis not present

## 2021-08-27 DIAGNOSIS — Z23 Encounter for immunization: Secondary | ICD-10-CM | POA: Diagnosis not present

## 2021-08-27 DIAGNOSIS — Z3A31 31 weeks gestation of pregnancy: Secondary | ICD-10-CM

## 2021-08-27 NOTE — Patient Instructions (Signed)
Childbirth Education Options: °[facility] Classes:  °Childbirth education classes can help you get ready for a positive parenting experience. You can also meet other expectant parents and get free stuff for your baby. Each class runs for five weeks on the same night and costs $45 for the mother-to-be and her support person. Medicaid covers the cost if you are eligible. Call [phone] to register. °[facility] Childbirth Education: °Classes can vary in availability and schedule is subject to change. For most up-to-date information please visit [email or web address] to review and register.  ° ° ° ° ° °AREA PEDIATRIC/FAMILY PRACTICE PHYSICIANS ° °[place] ([address]) °[facility] °Chambliss, MD; Eniola, MD; Hale, MD; Hensel, MD; McDiarmid, MD; McIntyer, MD; Neal, MD; Walden, MD °[address] [place], [place] [address] °[phone] °Mon-Fri 8:30-12:30, 1:30-5:00 °Providers come to see babies at [facility] °Accepting Medicaid °[facility] °Limited providers who accept newborns: Koirala, MD; Morrow, MD; Wolters, MD °[address], [place], [place] [address] °[phone] °Mon-Fri 8:00-5:30 °Babies seen by providers at [facility] °Does NOT accept Medicaid °Please call early in hospitalization for appointment (limited availability)  °[facility] °Mulberry, MD °[address] [place], [place] [address] °[phone] °Mon, Tue, Thur, Fri 8:30-5:00, Wed 10:00-7:00 (closed 1-2pm) °Babies seen by [facility] providers °Accepting Medicaid °[facility] °Rubin, MD °[address], [place], [place] [address] °[phone] °Mon-Fri 8:30-5:00, Sat 8:30-12:00 °Provider comes to see babies at [facility] °Accepting Medicaid °Must have been referred from current patients or contacted office prior to delivery °[facility] ([facility]) °Brown, MD; Chandler, MD; Ettefagh, MD; Grant, MD; Lester, MD; McCormick, MD; McQueen, MD; Prose, MD; Simha, MD; Stanley, MD; Stryffeler, NP; Tebben, NP °[address], [place], [place] [address] °[phone] °Mon, Tue, Thur, Fri 8:30-5:30, Wed 9:30-5:30, Sat 8:30-12:30 °Babies seen by [facility] providers °Accepting Medicaid °Only accepting infants of first-time parents or siblings of current patients °Hospital discharge coordinator will make follow-up appointment °Jack Amos °[address], [place], [place]  [address] °[phone]   Fax - [phone] °[facility] °[address], [place], [place]  [address] °Phone - [phone]   Fax - [phone] °Shilpa Gosrani °[address], [place], [place]  [address] °[phone] ° °[place] ([address]) °[facility] °Bates, MD; Brassfield, MD; Cooper, Cox, MD; MD; Davis, MD; Dovico, MD; Ettefaugh, MD; Little, MD; Lowe, MD; Keiffer, MD; Melvin, MD; Sumner, MD; Williams, MD °[address], [place], [place] [address] °[phone] °Mon-Fri 8:30-5:00 (extended evenings Mon-Thur as needed), Sat-Sun 10:00-1:00 °Providers come to see babies at [facility] °Accepting Medicaid for families of first-time babies and families with all children in the household age 3 and under. Must register with office prior to making appointment (M-F only). °[facility] °Henson, NP; Knapp, MD; Lalonde, MD; Tysinger, PA °[address] [place], [place] [address] °[phone] °Mon-Fri 8:00-5:00 °Babies seen by providers at [facility] °Does NOT accept Medicaid/Commercial Insurance Only °[facility] ([facility]  °Artis, MD; Barnes, MD; Bratton, MD; Coccaro, MD; Lockett Gardner, MD; Kramer, MD; Marshall, MD; Netherton, MD; Poleto, MD; Skinner, MD °[address] [place], [place]  [address] °[phone] °Mon-Fri 8:30-5:30, Sat (Oct.-Mar.) 9:00-1:00 °Babies seen by providers at [facility] °Accepting Medicaid ° °[place] ([address]) °[facility] °Reid, MD; Warner, MD °[address], [place], [place] [address] °[phone] °Mon-Fri 8:30-5:00, Sat 8:30-12:00 °Providers come to see babies at [facility] °Does NOT accept Medicaid °[facility] °Becker, PA; Hagler, MD; Scifres, PA; Sun, MD; Swayne, MD °[address], [place], [place] [address] °[phone] °Mon-Fri 8:00-5:00 °Babies seen by providers at [facility] °Does NOT accept Medicaid °Only accepting babies of parents who are patients °Please call early in hospitalization for appointment (limited availability) °[facility] °Clark, MD; Frye, MD; Kelleher, MD; Mack, NP; Miller, MD; O'Keller, MD; Patterson, NP; Pudlo, MD; Puzio, MD; Thomas, MD; Tucker, MD; Twiselton, MD °[address], [place], [place] [address] °[phone] °Mon-Fri 8:00-5:00, Sat 9:00-12:00 °Providers come to see babies at [facility] °Does NOT accept Medicaid ° °[place] ([address]) °[facility] °Limited providers accepting new patients: Brake, NP; Wharton, PA °[address], [place], [place] [address] °[phone] °Mon-Fri 8:00-5:00 °Babies seen by providers at [facility] °Does NOT accept Medicaid °Only accepting babies of parents who are patients °Please call early in hospitalization for appointment (limited availability) °[facility] °Gay, MD; Quinlan, MD °[address] [place], [place] [address] °[phone] (press 1 to schedule appointment) °Mon-Fri 8:00-5:00 °Providers come to see babies at [facility] °Does NOT accept Medicaid °[facility] °Mazer, MD °[address] [place], [place] [address] °[phone] °Mon-Fri 8:30-5:00 (lunch 12:30-1:00), extended hours by appointment only Wed  5:00-6:30 °Babies seen by [facility] providers °Accepting Medicaid °[facility] °Banks, MD; Jordan, MD; Koberlein, MD °[address], [place], [place] [address] °[phone] °Mon-Fri 8:00-5:00 °Babies seen by [facility] providers °Does NOT accept Medicaid °[facility] °Parker, MD; Hunter, MD; Wallace, DO °[address] [place], [place] [address] °[phone] °Mon-Fri 8:00-5:00 °Babies seen by [facility] providers °Does NOT accept Medicaid °[facility] °Brandon, PA; Brecken, PA; Christy, NP; Dees, MD; DeClaire, MD; DeWeese, MD; Hansen, NP; Mills, NP; Parrish, NP; Smoot, NP; Summer, MD; Vapne, MD °[address] [place], [place] [address] °[phone] °Mon-Fri 8:30-5:00, Sat 10:00-1:00 °Providers come to see babies at [facility] °Does NOT accept Medicaid °Free prenatal information session Tuesdays at 4:45pm °[facility] °Bouska, MD; Gordon, PA; Jeffery, PA; Weber, PA °[address] [place] [address] °[phone] °Mon-Fri 7:30-5:30 °Babies seen by [facility] providers °[facility] °[address], [place], [place]  [address] °[phone]   Fax - [phone] ° °[place] ([address]) °[facility] °Reese, MD °[address] [place], [place] [address] °[phone] °Mon-Thur 8:00-6:00 °Providers come to see babies at [facility] °Accepting Medicaid °[facility] °Anderson, NP; Badger, MD; Beal, PA; Spencer, PA °[address] [place], [place] [address] °[phone] °Mon-Thur 7:30-7:30, Fri 7:30-4:30 °Babies seen by [facility] providers °Accepting Medicaid °[facility] °Agbuya, MD; Klett, NP; Romgoolam, MD °[address], [place], [place] [address] °[phone] °Mon-Fri 8:30-5:00, Sat 8:30-12:00 °Providers come to see babies at [facility] °Accepting Medicaid °Must  have “Meet & Greet” appointment at office prior to delivery °[facility] ([facility]) °McCord, MD; Wallace, MD; Wood, MD °[address], [place], [place] [address] °[phone] °Mon-Wed 8:00-6:00, Thur-Fri 8:00-5:00, Sat 9:00-12:00 °Providers come to see babies at [facility] °Does NOT accept Medicaid °Only accepting siblings of current patients °[facility]   °[address], , [place]  [address] °[phone]   Fax - [phone] °[facility] °[address], [place], [place]  [address] °[phone]   Fax - [phone] ° °[place] ([address]) °[facility] °Cirigliano, DO; Matthews, DO °[address] [place], [place] [address] °[phone] °Mon-Fri 7:00-5:00 °Babies seen by [facility] providers °Does NOT accept Medicaid °[facility] °Briscoe, MD; Howley, PA; Moreira, PA °[address], [place], [place] [address] °[phone] °Mon-Fri 8:00-5:00 °Babies seen by [facility] providers °Accepting Medicaid °[facility] °Boyd, MD; Church, PA; Jones, NP; Osborn, PA °[address], [place], [place] [address] °[phone] °Mon-Fri 8:00-5:00 °Babies seen by providers at [facility] °Accepting Medicaid ° °[place] ([address]) °[facility] [place] °Wendling, DO °[address] [place], [place] [address] °[phone] °Mon-Fri 8:00-5:00 °Babies seen by [facility] providers °Does NOT accept Medicaid °Limited availability, please call early in hospitalization to schedule follow-up °[facility] °Calderon, PA; Cummings, MD; Dillard, MD; Martin, PA; Olson, MD; VanDeven, PA °[address], [place], [place] [address] °[phone] °Mon-Fri 8:30-5:00, Sat 9:00-12:00 °Babies  seen by providers at [facility] °Accepting Medicaid °Please register online then schedule online or call office °[email or web address] °[facility] ([facility]) °Hunter, NP; Kumar, MD; Martin Rogers, PA °[address], [place], [place] [address] °[phone] °Mon-Fri 8:00-5:00 °Babies seen by providers at [facility] °Accepting Medicaid °[facility] ([facility]) °Cole, MD; Kristi Fleenor, NP; West, MD °[address], [place], [place] [address] °[phone] °Mon-Fri 8:00-5:30, Sat&Sun by appointment (phones open at 8:30) °Babies seen by [facility] providers °Accepting Medicaid °Must be a first-time baby or sibling of current patient °[facility] [place]  °[address], [place], [place]  [address] °[phone]   Fax - [phone] ° °[place] ([address]) °[facility] °Brown, PA; Cowen, PA; Rice, MD; Helton, PA; Spry, MD °[address] [place], [place] [address] °[phone] °Mon-Thur 8:00-7:00, Fri 8:00-5:00, Sat 8:00-12:00, Sun 9:00-12:00 °Babies seen by [facility] providers °Accepting Medicaid °[facility] [place] °Coe-Goins, MD; Marshall, MD; Pierre-Louis, MD °[address], [place], [place] [address] °[phone] °Mon-Thur 8:00-5:00 °Babies seen by providers at [facility] °Accepting Medicaid °[facility] °Bratton, MD; Coe-Goins, MD; Hayes, MD; Lewis, MD; List, MD; Lott, MD; Marshall, MD; Moran, MD; O'Neal, MD; Pierre-Louis, MD; Pitonzo, MD; Scholer, MD; Spangle, MD °[address] [place], [place] [address] °[phone] °Mon-Fri 8:00-5:30, Sat (Oct.-Mar.) 9:00-1:00 °Babies seen by providers at [facility] °Accepting Medicaid °Must fill out new patient packet, available online at  [email or web address] °[facility] ([facility]) °Friddle, NP; Harris, NP; Kelly, NP; Logan, MD; Melvin, PA; Poth, MD; Ramadoss, MD; Stanton, NP °[address], [place], [place] [address] °[phone] °Mon-Thur 8:00-5:30, Fri 8:00-5:00 °Babies seen by providers at [facility] °Accepting Medicaid ° °[place] ([address]) °[facility] °Dixon, PA; , MD; Pickard, MD; Tapia, PA °[address], [place], [place] [address] °[phone] °Mon-Fri 8:00-5:00 °Babies seen by providers at [facility] °Accepting Medicaid  ° °[place] ([address]) °[facility] [place] °Masneri, DO; Meyers, MD; Nelson, PA °[address], [place],  [address] °[phone] °Mon-Fri 8:00-5:00 °Babies seen by providers at [facility] °Does NOT accept   Medicaid °Limited appointment availability, please call early in hospitalization ° °[facility] [place] °Kunedd, DO; McGowen, MD °[address], [place], [place] [address] °[phone] °Mon-Fri 8:00-5:00 °Babies seen by [facility] providers °Does NOT accept Medicaid °[facility] [place] °Cameron, MD; MacDonald, MD; Michaels, PA; Nayak, MD °[address] [place] [address], [place], [place] [address] °[phone] °Mon-Fri 8:00-5:00 °After hours clinic ([address] [place], [place] [address]) [phone] Mon-Fri 5:00-8:00, Sat 12:00-6:00, Sun 10:00-4:00 °Babies seen by [facility] providers °Accepting Medicaid °[facility] [place] °[address], [place], [place]  [address] °[phone]   Fax - [phone] ° °[place] ([address]) °[facility] [place] [facility] °Andy, MD °[address], [place], [place] [address] °[phone] °Mon-Fri 8:00-5:00 °Babies seen by [facility] providers °Does NOT accept Medicaid °[facility] [place] ([facility]  [place]) °Eksir, MD °[address], [place], [place] [address] °[phone] °Mon-Thur 8:00-7:00, Fri 8:00-5:00, Sat 8:00-12:00 °Babies seen by providers at [facility] °Accepting Medicaid - but does not have vaccinations in office (must be received elsewhere) °Limited availability, please call early in hospitalization ° °[place] ([address]) °[facility]  °Charlene Flemming, MD °[address], [place] [address] °[phone]  Fax [phone] ° °[place] °[facility]  °[facility] Services Center  °Kimberly Newton, MD, Annamarie Streilein, PA, Carla Hampton, PA °[address] [place] °[place], [place] [address] °[phone] °[facility]  °[address], [place], [place] [address] °[phone] °[address], [place], [place] [address] °[phone] (West Office)  °[facility] °[address], [place], [place] [address] °[phone] °[facility] °[address], [place], [place] [address] °[phone] °[facility] °[address], [place], [place] [address] °[phone] °[facility] °[address], [place], [place] [address] °[phone] °[facility] °[address], [place], [place] [address] °[phone] °[facility] °[address], [place], [place] [address] °[phone] °[facility]  °[address], [place], [place] [address] °[phone] °Dr. Robert W. Little °[address] [place], [place], [place] [address] °[phone] °[facility] °[address], [place], [place] [address] °[phone] °[facility] °[address], [place],  [address] °[phone] ° °

## 2021-08-27 NOTE — Progress Notes (Signed)
   PRENATAL VISIT NOTE  Subjective:  Mackenzie Elliott is a 26 y.o. G1P0 at [redacted]w[redacted]d being seen today for ongoing prenatal care.  She is currently monitored for the following issues for this low-risk pregnancy and has Supervision of normal first pregnancy on their problem list.  Patient reports fatigue and occasional contractions.  Contractions: Irritability. Vag. Bleeding: None.  Movement: Present. Denies leaking of fluid.   The following portions of the patient's history were reviewed and updated as appropriate: allergies, current medications, past family history, past medical history, past social history, past surgical history and problem list.   Objective:   Vitals:   08/27/21 1134  BP: 107/63  Pulse: 82  Weight: 158 lb 9.6 oz (71.9 kg)    Fetal Status: Fetal Heart Rate (bpm): 136 Fundal Height: 31 cm Movement: Present     General:  Alert, oriented and cooperative. Patient is in no acute distress.  Skin: Skin is warm and dry. No rash noted.   Cardiovascular: Normal heart rate noted  Respiratory: Normal respiratory effort, no problems with respiration noted  Abdomen: Soft, gravid, appropriate for gestational age.  Pain/Pressure: Present     Pelvic: Cervical exam deferred        Extremities: Normal range of motion.  Edema: Trace  Mental Status: Normal mood and affect. Normal behavior. Normal judgment and thought content.   Assessment and Plan:  Pregnancy: G1P0 at [redacted]w[redacted]d 1. Encounter for supervision of normal first pregnancy in third trimester - Tdap vaccine greater than or equal to 7yo IM - Normal third trimester labs reviewed from last visit  - Started OTC iron, Hgb 10.7 - Planning to breastfeed - Unsure of Wanship list given and importance of choosing Peds prior to delivery discussed  - CBE given   2. [redacted] weeks gestation of pregnancy  Preterm labor symptoms and general obstetric precautions including but not limited to vaginal bleeding, contractions, leaking of fluid and  fetal movement were reviewed in detail with the patient. Please refer to After Visit Summary for other counseling recommendations.   Return in about 2 weeks (around 09/10/2021) for LOB, In-Person, any provider.  No future appointments.  Kerry Hough, PA-C

## 2021-09-10 ENCOUNTER — Ambulatory Visit (INDEPENDENT_AMBULATORY_CARE_PROVIDER_SITE_OTHER): Payer: 59 | Admitting: Women's Health

## 2021-09-10 ENCOUNTER — Encounter: Payer: Self-pay | Admitting: Women's Health

## 2021-09-10 VITALS — BP 109/69 | HR 90 | Wt 162.0 lb

## 2021-09-10 DIAGNOSIS — B009 Herpesviral infection, unspecified: Secondary | ICD-10-CM

## 2021-09-10 DIAGNOSIS — Z3403 Encounter for supervision of normal first pregnancy, third trimester: Secondary | ICD-10-CM

## 2021-09-10 MED ORDER — ACYCLOVIR 400 MG PO TABS: 400.0000 mg | ORAL_TABLET | Freq: Three times a day (TID) | ORAL | 3 refills | Status: DC

## 2021-09-10 NOTE — Progress Notes (Signed)
LOW-RISK PREGNANCY VISIT Patient name: Mackenzie Elliott MRN [id number]  Date of birth: 1995-05-14 Chief Complaint:   low risk gestation and Routine Prenatal Visit  History of Present Illness:   Mackenzie Elliott is a 26 y.o. G1P0 female at [redacted]w[redacted]d with an Estimated Date of Delivery: 10/26/21 being seen today for ongoing management of a low-risk pregnancy.   Today she reports  cold/sinus sx began Fri, sore throat, runny nose, congestion. Now mainly  just chest congestion/productive cough. No fever/chills, does have sweats at night, but that is normal for her. Feels improved from when sx began . Wonders if she needs antibiotics.  Contractions: Not present.  .  Movement: Present. denies leaking of fluid.     08/06/2021    9:04 AM 04/23/2021   11:01 AM  Depression screen PHQ 2/9  Decreased Interest 1 2  Down, Depressed, Hopeless 1 2  PHQ - 2 Score 2 4  Altered sleeping 2 2  Tired, decreased energy 2 3  Change in appetite 0 2  Feeling bad or failure about yourself  0 1  Trouble concentrating 1 1  Moving slowly or fidgety/restless 0 0  Suicidal thoughts 0 0  PHQ-9 Score 7 13        08/06/2021    9:04 AM 04/23/2021   11:02 AM  GAD 7 : Generalized Anxiety Score  Nervous, Anxious, on Edge 2 2  Control/stop worrying 1 2  Worry too much - different things 2 2  Trouble relaxing 2 2  Restless 0 0  Easily annoyed or irritable 2 2  Afraid - awful might happen 1 0  Total GAD 7 Score 10 10      Review of Systems:   Pertinent items are noted in HPI Denies abnormal vaginal discharge w/ itching/odor/irritation, headaches, visual changes, shortness of breath, chest pain, abdominal pain, severe nausea/vomiting, or problems with urination or bowel movements unless otherwise stated above. Pertinent History Reviewed:  Reviewed past medical,surgical, social, obstetrical and family history.  Reviewed problem list, medications and allergies. Physical Assessment:   Vitals:   09/10/21 1354  BP: 109/69   Pulse: 90  Weight: 162 lb (73.5 kg)  Body mass index is 29.63 kg/m.        Physical Examination:   General appearance: Well appearing, and in no distress  Mental status: Alert, oriented to person, place, and time  Skin: Warm & dry  Cardiovascular: Normal heart rate & rhythm noted  Respiratory: Normal respiratory effort, no distress, LCTAB  Abdomen: Soft, gravid, nontender  Pelvic: Cervical exam deferred         Extremities: Edema: None  Fetal Status: Fetal Heart Rate (bpm): 142 Fundal Height: 32 cm Movement: Present    Chaperone: N/A   No results found for this or any previous visit (from the past 24 hour(s)).  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    Dispense:  90 tablet    Refill:  3    Order Specific  Question:   Supervising Provider    Answer:   Florian Buff [[id number]]   Labs/procedures today: none  Plan:  Continue routine obstetrical care  Next visit: prefers in person    Reviewed: Preterm labor symptoms and general obstetric precautions including but not limited to vaginal bleeding, contractions, leaking of fluid and fetal movement were reviewed in detail with the patient.  All questions were answered. Does have home bp cuff. Office bp cuff given: not applicable. Check bp weekly, let us know if consistently >140 and/or >90.  Follow-up: Return in about 2 weeks (around 09/24/2021) for [place], [place], in person.  No future appointments.  No orders of the defined types were placed in this  encounter.  [place], [facility] 09/10/2021 2:19 PM

## 2021-09-10 NOTE — Patient Instructions (Signed)
Mackenzie Elliott, thank you for choosing our office today! We appreciate the opportunity to meet your healthcare needs. You may receive a short survey by mail, e-mail, or through MyChart. If you are happy with your care we would appreciate if you could take just a few minutes to complete the survey questions. We read all of your comments and take your feedback very seriously. Thank you again for choosing our office.  [facility] ([address] [place], [place] [address]) Entrance C, located off of [address] Free 24/7 valet parking   CLASSES: Go to [email or web address] to register for classes (childbirth, breastfeeding, waterbirth, infant CPR, daddy bootcamp, etc.)  Call the office ([phone]) or go to [facility] if: You begin to have strong, frequent contractions Your water breaks.  Sometimes it is a big gush of fluid, sometimes it is just a trickle that keeps getting your panties wet or running down your legs You have vaginal bleeding.  It is normal to have a small amount of spotting if your cervix was checked.  You don't feel your baby moving like normal.  If you don't, get you something to eat and drink and lay down and focus on feeling your baby move.   If your baby is still not moving like normal, you should call the office or go to [facility].  Call the office ([phone]) or go to [facility] for these signs of pre-eclampsia: Severe headache that does not go away with Tylenol Visual changes- seeing spots, double, blurred vision Pain under your right breast or upper abdomen that does not go away with Tums or heartburn medicine Nausea and/or vomiting Severe swelling in your hands, feet, and face   Tdap Vaccine It is recommended that you get the Tdap vaccine during the third trimester of EACH pregnancy to help protect your baby from getting pertussis (whooping cough) 27-36 weeks is the BEST time to do this  so that you can pass the protection on to your baby. During pregnancy is better than after pregnancy, but if you are unable to get it during pregnancy it will be offered at the hospital.  You can get this vaccine with us, at the health department, your family doctor, or some local pharmacies Everyone who will be around your baby should also be up-to-date on their vaccines before the baby comes. Adults (who are not pregnant) only need 1 dose of Tdap during adulthood.   [place] Pediatricians/Family Doctors [facility] ([facility]): [address], [phone]           [facility]: [address], [phone]                [facility] ([facility]): [address], [phone] (call to ask if accepting patients) [facility]: [address], [place], [phone]    [place] Pediatricians/Family Doctors [facility] ([facility]): [address], [phone] [facility]: [address], [phone] [facility]: [address], [phone]  [place] [facility]  [facility] ([facility]): [phone] [facility]: [address], [phone]   [place] Family Doctors [facility]: [address], [phone]  [place] [facility]  [facility]: [phone], [phone]  Home Blood Pressure Monitoring for Patients   Your provider has recommended that you check your   blood pressure (BP) at least once a week at home. If you do not have a blood pressure cuff at home, one will be provided for you. Contact your provider if you have not received your monitor within 1 week.   Helpful Tips for Accurate Home Blood Pressure Checks  Don't smoke, exercise, or drink caffeine 30 minutes before checking your BP Use the restroom before checking your BP (a full bladder can raise your  pressure) Relax in a comfortable upright chair Feet on the ground Left arm resting comfortably on a flat surface at the level of your heart Legs uncrossed Back supported Sit quietly and don't talk Place the cuff on your bare arm Adjust snuggly, so that only two fingertips can fit between your skin and the top of the cuff Check 2 readings separated by at least one minute Keep a log of your BP readings For a visual, please reference this diagram: http://ccnc.care/bpdiagram  Provider Name: [facility]     Phone: [phone]  Zone 1: ALL CLEAR  Continue to monitor your symptoms:  BP reading is less than 140 (top number) or less than 90 (bottom number)  No right upper stomach pain No headaches or seeing spots No feeling nauseated or throwing up No swelling in face and hands  Zone 2: CAUTION Call your doctor's office for any of the following:  BP reading is greater than 140 (top number) or greater than 90 (bottom number)  Stomach pain under your ribs in the middle or right side Headaches or seeing spots Feeling nauseated or throwing up Swelling in face and hands  Zone 3: EMERGENCY  Seek immediate medical care if you have any of the following:  BP reading is greater than160 (top number) or greater than 110 (bottom number) Severe headaches not improving with Tylenol Serious difficulty catching your breath Any worsening symptoms from Zone 2  Preterm Labor and Birth Information  The normal length of a pregnancy is 39-41 weeks. Preterm labor is when labor starts before 37 completed weeks of pregnancy. What are the risk factors for preterm labor? Preterm labor is more likely to occur in women who: Have certain infections during pregnancy such as a bladder infection, sexually transmitted infection, or infection inside the uterus (chorioamnionitis). Have a shorter-than-normal cervix. Have gone into preterm labor before. Have had surgery on their cervix. Are younger than age 17  or older than age 35. Are African American. Are pregnant with twins or multiple babies (multiple gestation). Take street drugs or smoke while pregnant. Do not gain enough weight while pregnant. Became pregnant shortly after having been pregnant. What are the symptoms of preterm labor? Symptoms of preterm labor include: Cramps similar to those that can happen during a menstrual period. The cramps may happen with diarrhea. Pain in the abdomen or lower back. Regular uterine contractions that may feel like tightening of the abdomen. A feeling of increased pressure in the pelvis. Increased watery or bloody mucus discharge from the vagina. Water breaking (ruptured amniotic sac). Why is it important to recognize signs of preterm labor? It is important to recognize signs of preterm labor because babies who are born prematurely may not be fully developed. This can put them at an increased risk for: Long-term (chronic) heart and lung problems. Difficulty immediately after birth with regulating body systems, including blood sugar, body temperature, heart rate, and breathing rate. Bleeding in the brain. Cerebral palsy. Learning difficulties. Death. These risks are highest for babies who are born before 34 weeks   of pregnancy. How is preterm labor treated? Treatment depends on the length of your pregnancy, your condition, and the health of your baby. It may involve: Having a stitch (suture) placed in your cervix to prevent your cervix from opening too early (cerclage). Taking or being given medicines, such as: Hormone medicines. These may be given early in pregnancy to help support the pregnancy. Medicine to stop contractions. Medicines to help mature the baby's lungs. These may be prescribed if the risk of delivery is high. Medicines to prevent your baby from developing cerebral palsy. If the labor happens before 34 weeks of pregnancy, you may need to stay in the hospital. What should I do if I  think I am in preterm labor? If you think that you are going into preterm labor, call your health care provider right away. How can I prevent preterm labor in future pregnancies? To increase your chance of having a full-term pregnancy: Do not use any tobacco products, such as cigarettes, chewing tobacco, and e-cigarettes. If you need help quitting, ask your health care provider. Do not use street drugs or medicines that have not been prescribed to you during your pregnancy. Talk with your health care provider before taking any herbal supplements, even if you have been taking them regularly. Make sure you gain a healthy amount of weight during your pregnancy. Watch for infection. If you think that you might have an infection, get it checked right away. Make sure to tell your health care provider if you have gone into preterm labor before. This information is not intended to replace advice given to you by your health care provider. Make sure you discuss any questions you have with your health care provider. Document Revised: 07/09/2018 Document Reviewed: 08/08/2015 Elsevier Patient Education  2020 Elsevier Inc.   

## 2021-09-25 ENCOUNTER — Ambulatory Visit (INDEPENDENT_AMBULATORY_CARE_PROVIDER_SITE_OTHER): Payer: 59 | Admitting: Obstetrics & Gynecology

## 2021-09-25 VITALS — BP 113/72 | HR 85 | Wt 161.4 lb

## 2021-09-25 DIAGNOSIS — B009 Herpesviral infection, unspecified: Secondary | ICD-10-CM

## 2021-09-25 DIAGNOSIS — Z3A35 35 weeks gestation of pregnancy: Secondary | ICD-10-CM | POA: Diagnosis not present

## 2021-09-25 DIAGNOSIS — Z3403 Encounter for supervision of normal first pregnancy, third trimester: Secondary | ICD-10-CM

## 2021-09-25 DIAGNOSIS — O368190 Decreased fetal movements, unspecified trimester, not applicable or unspecified: Secondary | ICD-10-CM

## 2021-09-25 MED ORDER — ACYCLOVIR 400 MG PO TABS: 400.0000 mg | ORAL_TABLET | Freq: Three times a day (TID) | ORAL | 3 refills | Status: DC

## 2021-09-25 NOTE — Progress Notes (Signed)
LOW-RISK PREGNANCY VISIT Patient name: Mackenzie Elliott MRN [id number]  Date of birth: 09-22-1995 Chief Complaint:   Routine Prenatal Visit  History of Present Illness:   Mackenzie Elliott is a 26 y.o. G1P0 female at [redacted]w[redacted]d with an Estimated Date of Delivery: 10/26/21 being seen today for ongoing management of a low-risk pregnancy.      08/06/2021    9:04 AM 04/23/2021   11:01 AM  Depression screen PHQ 2/9  Decreased Interest 1 2  Down, Depressed, Hopeless 1 2  PHQ - 2 Score 2 4  Altered sleeping 2 2  Tired, decreased energy 2 3  Change in appetite 0 2  Feeling bad or failure about yourself  0 1  Trouble concentrating 1 1  Moving slowly or fidgety/restless 0 0  Suicidal thoughts 0 0  PHQ-9 Score 7 13    Over the weekend for about 36hrs- reports not feeling well with fever, nausea/vomiting and abdominal pain.  Then wok up Sunday feeling better, but since that time notes change in fetal movement.   Contractions: Irritability. Vag. Bleeding: None.  Movement: Present. denies leaking of fluid. Review of Systems:   Pertinent items are noted in HPI Denies abnormal vaginal discharge w/ itching/odor/irritation, headaches, visual changes, shortness of breath, chest pain, abdominal pain, severe nausea/vomiting, or problems with urination or bowel movements unless otherwise stated above. Pertinent History Reviewed:  Reviewed past medical,surgical, social, obstetrical and family history.  Reviewed problem list, medications and allergies.  Physical Assessment:   Vitals:   09/25/21 1159  BP: 113/72  Pulse: 85  Weight: 161 lb 6.4 oz (73.2 kg)  Body mass index is 29.52 kg/m.        Physical Examination:   General appearance: Well appearing, and in no distress  Mental status: Alert, oriented to person, place, and time  Skin: Warm & dry  Respiratory: Normal respiratory effort, no distress  Abdomen: Soft, gravid, nontender  Pelvic: Cervical exam deferred         Extremities: Edema:  None  Psych:  mood and affect appropriate  Fetal Status:     Movement: Present    NST being performed due to decreased fetal movement   Fetal Monitoring:  Baseline: 130 bpm, Variability: moderate, Accelerations: present, The accelerations are >15 bpm and more than 2 in 20 minutes, and Decelerations: Absent     Final diagnosis:   Reactive NST   Chaperone: n/a    No results found for this or any previous visit (from the past 24 hour(s)).   Assessment & Plan:  1) Low-risk pregnancy G1P0 at [redacted]w[redacted]d with an Estimated Date of Delivery: 10/26/21   2) Decreased fetal movement -Reactive NST -discussed fetal kick counts, if not feeling appropriate movement, [facility]  3) HSV2 -prescription sent in for suppression  Meds:  Meds ordered this encounter  Medications   acyclovir (ZOVIRAX) 400 MG tablet    Sig: Take 1 tablet (400 mg total) by mouth 3 (three) times daily.    Dispense:  90 tablet    Refill:  3   Labs/procedures today: NST  Plan:  Continue routine obstetrical care Next visit: prefers in person    Reviewed: Preterm labor symptoms and general obstetric precautions including but not limited to vaginal bleeding, contractions, leaking of fluid and fetal movement were reviewed in detail with the patient.  All questions were answered. Pt has home bp cuff. Check bp weekly, let us know if >140/90.   Follow-up: Return in about 1  week (around 10/02/2021) for LROB visit.  No orders of the defined types were placed in this encounter.   Myna Hidalgo, DO Attending Obstetrician & Gynecologist, [facility] for [employer], [facility]

## 2021-10-04 ENCOUNTER — Other Ambulatory Visit (HOSPITAL_COMMUNITY)
Admission: RE | Admit: 2021-10-04 | Discharge: 2021-10-04 | Disposition: A | Discharge status: Home / Self Care | Payer: 59 | Source: Ambulatory Visit | Attending: Obstetrics & Gynecology | Admitting: Obstetrics & Gynecology

## 2021-10-04 ENCOUNTER — Ambulatory Visit (INDEPENDENT_AMBULATORY_CARE_PROVIDER_SITE_OTHER): Payer: 59 | Admitting: Obstetrics & Gynecology

## 2021-10-04 ENCOUNTER — Encounter: Payer: Self-pay | Admitting: Obstetrics & Gynecology

## 2021-10-04 VITALS — BP 111/76 | HR 95 | Wt 167.0 lb

## 2021-10-04 DIAGNOSIS — Z3403 Encounter for supervision of normal first pregnancy, third trimester: Secondary | ICD-10-CM | POA: Insufficient documentation

## 2021-10-04 DIAGNOSIS — Z3A36 36 weeks gestation of pregnancy: Secondary | ICD-10-CM | POA: Diagnosis present

## 2021-10-04 NOTE — Progress Notes (Signed)
   LOW-RISK PREGNANCY VISIT Patient name: Mackenzie Elliott MRN [id number]  Date of birth: [birth year] Chief Complaint:   Routine Prenatal Visit  History of Present Illness:   Mackenzie Elliott is a 26 y.o. G1P0 female at [redacted]w[redacted]d with an Estimated Date of Delivery: 10/26/21 being seen today for ongoing management of a low-risk pregnancy.     08/06/2021    9:04 AM 04/23/2021   11:01 AM  Depression screen PHQ 2/9  Decreased Interest 1 2  Down, Depressed, Hopeless 1 2  PHQ - 2 Score 2 4  Altered sleeping 2 2  Tired, decreased energy 2 3  Change in appetite 0 2  Feeling bad or failure about yourself  0 1  Trouble concentrating 1 1  Moving slowly or fidgety/restless 0 0  Suicidal thoughts 0 0  PHQ-9 Score 7 13    Today she reports no complaints. Contractions: Irritability. Vag. Bleeding: None.  Movement: Present. denies leaking of fluid. Review of Systems:   Pertinent items are noted in HPI Denies abnormal vaginal discharge w/ itching/odor/irritation, headaches, visual changes, shortness of breath, chest pain, abdominal pain, severe nausea/vomiting, or problems with urination or bowel movements unless otherwise stated above. Pertinent History Reviewed:  Reviewed past medical,surgical, social, obstetrical and family history.  Reviewed problem list, medications and allergies. Physical Assessment:   Vitals:   10/04/21 0858  BP: 111/76  Pulse: 95  Weight: 167 lb (75.8 kg)  Body mass index is 30.54 kg/m.        Physical Examination:   General appearance: Well appearing, and in no distress  Mental status: Alert, oriented to person, place, and time  Skin: Warm & dry  Cardiovascular: Normal heart rate noted  Respiratory: Normal respiratory effort, no distress  Abdomen: Soft, gravid, nontender  Pelvic: Cervical exam performed  Dilation: Closed Effacement (%): Thick Station: Ballotable  Extremities: Edema: None  Fetal Status: Fetal Heart Rate (bpm): 151 Fundal Height: 36 cm Movement: Present  Presentation: Vertex  Chaperone: Latisha Cresenzo    No results found for this or any previous visit (from the past 24 hour(s)).  Assessment & Plan:  1) Low-risk pregnancy G1P0 at [redacted]w[redacted]d with an Estimated Date of Delivery: 10/26/21   2) cultures done,    Meds: No orders of the defined types were placed in this encounter.  Labs/procedures today: cultures  Plan:  Continue routine obstetrical care  Next visit: prefers in person    Reviewed: Term labor symptoms and general obstetric precautions including but not limited to vaginal bleeding, contractions, leaking of fluid and fetal movement were reviewed in detail with the patient.  All questions were answered. Has home bp cuff. Rx faxed to . Check bp weekly, let us know if >140/90.   Follow-up: Return in about 1 week (around 10/11/2021) for LROB.  Orders Placed This Encounter  Procedures   Culture, beta strep (group b only)    Lazaro Arms, MD 10/04/2021 9:30 AM

## 2021-10-07 LAB — CERVICOVAGINAL ANCILLARY ONLY
Chlamydia: NEGATIVE
Comment: NEGATIVE
Comment: NORMAL
Neisseria Gonorrhea: NEGATIVE

## 2021-10-08 LAB — CULTURE, BETA STREP (GROUP B ONLY): Strep Gp B Culture: NEGATIVE

## 2021-10-11 ENCOUNTER — Ambulatory Visit (INDEPENDENT_AMBULATORY_CARE_PROVIDER_SITE_OTHER): Payer: 59 | Admitting: Obstetrics & Gynecology

## 2021-10-11 ENCOUNTER — Encounter: Payer: Self-pay | Admitting: Obstetrics & Gynecology

## 2021-10-11 VITALS — BP 113/71 | HR 80 | Wt 169.0 lb

## 2021-10-11 DIAGNOSIS — Z3403 Encounter for supervision of normal first pregnancy, third trimester: Secondary | ICD-10-CM

## 2021-10-11 NOTE — Progress Notes (Signed)
   LOW-RISK PREGNANCY VISIT Patient name: Mackenzie Elliott MRN [id number]  Date of birth: Aug 16, 1995 Chief Complaint:   Routine Prenatal Visit  History of Present Illness:   Mackenzie Elliott is a 26 y.o. G1P0 female at [redacted]w[redacted]d with an Estimated Date of Delivery: 10/26/21 being seen today for ongoing management of a low-risk pregnancy.     08/06/2021    9:04 AM 04/23/2021   11:01 AM  Depression screen PHQ 2/9  Decreased Interest 1 2  Down, Depressed, Hopeless 1 2  PHQ - 2 Score 2 4  Altered sleeping 2 2  Tired, decreased energy 2 3  Change in appetite 0 2  Feeling bad or failure about yourself  0 1  Trouble concentrating 1 1  Moving slowly or fidgety/restless 0 0  Suicidal thoughts 0 0  PHQ-9 Score 7 13    Today she reports pelvic/back pressure. Contractions: Irritability. Vag. Bleeding: None.  Movement: Present. denies leaking of fluid. Review of Systems:   Pertinent items are noted in HPI Denies abnormal vaginal discharge w/ itching/odor/irritation, headaches, visual changes, shortness of breath, chest pain, abdominal pain, severe nausea/vomiting, or problems with urination or bowel movements unless otherwise stated above. Pertinent History Reviewed:  Reviewed past medical,surgical, social, obstetrical and family history.  Reviewed problem list, medications and allergies. Physical Assessment:   Vitals:   10/11/21 1156  BP: 113/71  Pulse: 80  Weight: 169 lb (76.7 kg)  Body mass index is 30.91 kg/m.        Physical Examination:   General appearance: Well appearing, and in no distress  Mental status: Alert, oriented to person, place, and time  Skin: Warm & dry  Cardiovascular: Normal heart rate noted  Respiratory: Normal respiratory effort, no distress  Abdomen: Soft, gravid, nontender  Pelvic: Cervical exam performed  Dilation: 1.5 Effacement (%): Thick Station: -3  Extremities: Edema: None  Fetal Status: Fetal Heart Rate (bpm): 145 Fundal Height: 38 cm Movement: Present  Presentation: Vertex  Chaperone: Amanda Rash    No results found for this or any previous visit (from the past 24 hour(s)).  Assessment & Plan:  1) Low-risk pregnancy G1P0 at [redacted]w[redacted]d with an Estimated Date of Delivery: 10/26/21   2) right anterior scalp lesion, recommend postpartum removal,    Meds: No orders of the defined types were placed in this encounter.  Labs/procedures today:   Plan:  Continue routine obstetrical care  Next visit: prefers in person    Reviewed: Term labor symptoms and general obstetric precautions including but not limited to vaginal bleeding, contractions, leaking of fluid and fetal movement were reviewed in detail with the patient.  All questions were answered. Has home bp cuff. Rx faxed to . Check bp weekly, let us know if >140/90.   Follow-up: Return in about 1 week (around 10/18/2021) for LROB.  No orders of the defined types were placed in this encounter.   Lazaro Arms, MD 10/11/2021 12:51 PM

## 2021-10-15 ENCOUNTER — Encounter: Payer: Self-pay | Admitting: Obstetrics & Gynecology

## 2021-10-18 ENCOUNTER — Ambulatory Visit (INDEPENDENT_AMBULATORY_CARE_PROVIDER_SITE_OTHER): Payer: 59 | Admitting: Obstetrics & Gynecology

## 2021-10-18 VITALS — BP 119/76 | HR 88 | Wt 168.6 lb

## 2021-10-18 DIAGNOSIS — Z3403 Encounter for supervision of normal first pregnancy, third trimester: Secondary | ICD-10-CM

## 2021-10-18 DIAGNOSIS — B009 Herpesviral infection, unspecified: Secondary | ICD-10-CM

## 2021-10-18 NOTE — Progress Notes (Signed)
   LOW-RISK PREGNANCY VISIT Patient name: Mackenzie Elliott MRN [id number]  Date of birth: [birth year] Chief Complaint:   Routine Prenatal Visit  History of Present Illness:   Mackenzie Elliott is a 26 y.o. G1P0 female at [redacted]w[redacted]d with an Estimated Date of Delivery: 10/26/21 being seen today for ongoing management of a low-risk pregnancy.     08/06/2021    9:04 AM 04/23/2021   11:01 AM  Depression screen PHQ 2/9  Decreased Interest 1 2  Down, Depressed, Hopeless 1 2  PHQ - 2 Score 2 4  Altered sleeping 2 2  Tired, decreased energy 2 3  Change in appetite 0 2  Feeling bad or failure about yourself  0 1  Trouble concentrating 1 1  Moving slowly or fidgety/restless 0 0  Suicidal thoughts 0 0  PHQ-9 Score 7 13    Today she reports no complaints. Contractions: Irritability. Vag. Bleeding: None.  Movement: Present. denies leaking of fluid.  Pt had called in concerned about HSV outbreak- it has since resolved.  She thinks it was actually irritation from IC as it resolved within a few hours.  Denies itching, irritation.  Denies ulcer or lesion.  No acute complaints.  Review of Systems:   Pertinent items are noted in HPI Denies abnormal vaginal discharge w/ itching/odor/irritation, headaches, visual changes, shortness of breath, chest pain, abdominal pain, severe nausea/vomiting, or problems with urination or bowel movements unless otherwise stated above. Pertinent History Reviewed:  Reviewed past medical,surgical, social, obstetrical and family history.  Reviewed problem list, medications and allergies.  Physical Assessment:   Vitals:   10/18/21 1120  BP: 119/76  Pulse: 88  Weight: 168 lb 9.6 oz (76.5 kg)  Body mass index is 30.84 kg/m.        Physical Examination:   General appearance: Well appearing, and in no distress  Mental status: Alert, oriented to person, place, and time  Skin: Warm & dry  Respiratory: Normal respiratory effort, no distress  Abdomen: Soft, gravid,  nontender  Pelvic:  no external abnormalities noted, vagina: pink moist mucosa with minimal white discharge, no vaginal or cervical lesions noted   Dilation: 1 Effacement (%): Thick Station: -3  Extremities: Edema: None  Psych:  mood and affect appropriate  Fetal Status: Fetal Heart Rate (bpm): 145 Fundal Height: 38 cm Movement: Present    Chaperone:  pt declined     No results found for this or any previous visit (from the past 24 hour(s)).   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  Labs/procedures today: none  Plan:  Continue routine obstetrical care  Next visit: prefers in person    Reviewed: Term labor symptoms and general obstetric precautions including but not limited to vaginal bleeding, contractions, leaking of fluid and fetal movement were reviewed in detail with the patient.  All questions were answered. Pt has home bp cuff. Check bp weekly, let us know if >140/90.   Follow-up: Return in about 1 week (around 10/25/2021) for LROB visit.  No orders of the defined types were placed in this encounter.   Myna Hidalgo, DO Attending Obstetrician & Gynecologist, [facility] for [employer], [facility]

## 2021-10-23 ENCOUNTER — Other Ambulatory Visit: Payer: Self-pay

## 2021-10-23 ENCOUNTER — Encounter (HOSPITAL_COMMUNITY): Payer: Self-pay | Admitting: Obstetrics and Gynecology

## 2021-10-23 ENCOUNTER — Inpatient Hospital Stay (HOSPITAL_COMMUNITY): Payer: 59 | Admitting: Anesthesiology

## 2021-10-23 ENCOUNTER — Inpatient Hospital Stay (HOSPITAL_COMMUNITY)
Admission: AD | Admit: 2021-10-23 | Discharge: 2021-10-26 | DRG: 786 | Disposition: A | Discharge status: Home / Self Care | Payer: 59 | Attending: Obstetrics and Gynecology | Admitting: Obstetrics and Gynecology

## 2021-10-23 DIAGNOSIS — O9832 Other infections with a predominantly sexual mode of transmission complicating childbirth: Secondary | ICD-10-CM | POA: Diagnosis present

## 2021-10-23 DIAGNOSIS — O26893 Other specified pregnancy related conditions, third trimester: Secondary | ICD-10-CM | POA: Diagnosis present

## 2021-10-23 DIAGNOSIS — O411290 Chorioamnionitis, unspecified trimester, not applicable or unspecified: Secondary | ICD-10-CM | POA: Diagnosis not present

## 2021-10-23 DIAGNOSIS — O4292 Full-term premature rupture of membranes, unspecified as to length of time between rupture and onset of labor: Secondary | ICD-10-CM | POA: Diagnosis present

## 2021-10-23 DIAGNOSIS — F419 Anxiety disorder, unspecified: Secondary | ICD-10-CM | POA: Diagnosis not present

## 2021-10-23 DIAGNOSIS — Z3A39 39 weeks gestation of pregnancy: Secondary | ICD-10-CM

## 2021-10-23 DIAGNOSIS — Z3403 Encounter for supervision of normal first pregnancy, third trimester: Principal | ICD-10-CM

## 2021-10-23 DIAGNOSIS — O9081 Anemia of the puerperium: Secondary | ICD-10-CM | POA: Diagnosis not present

## 2021-10-23 DIAGNOSIS — A6000 Herpesviral infection of urogenital system, unspecified: Secondary | ICD-10-CM | POA: Diagnosis present

## 2021-10-23 DIAGNOSIS — O411230 Chorioamnionitis, third trimester, not applicable or unspecified: Secondary | ICD-10-CM | POA: Diagnosis present

## 2021-10-23 DIAGNOSIS — Z3400 Encounter for supervision of normal first pregnancy, unspecified trimester: Secondary | ICD-10-CM

## 2021-10-23 DIAGNOSIS — O4290 Premature rupture of membranes, unspecified as to length of time between rupture and onset of labor, unspecified weeks of gestation: Secondary | ICD-10-CM

## 2021-10-23 DIAGNOSIS — B009 Herpesviral infection, unspecified: Secondary | ICD-10-CM | POA: Diagnosis present

## 2021-10-23 DIAGNOSIS — O99344 Other mental disorders complicating childbirth: Secondary | ICD-10-CM | POA: Diagnosis not present

## 2021-10-23 HISTORY — DX: Unspecified ovarian cyst, right side: N83.201

## 2021-10-23 LAB — CBC
HCT: 32.4 % — ABNORMAL LOW (ref 36.0–46.0)
Hemoglobin: 11.4 g/dL — ABNORMAL LOW (ref 12.0–15.0)
MCH: 33.9 pg (ref 26.0–34.0)
MCHC: 35.2 g/dL (ref 30.0–36.0)
MCV: 96.4 fL (ref 80.0–100.0)
Platelets: 233 10*3/uL (ref 150–400)
RBC: 3.36 MIL/uL — ABNORMAL LOW (ref 3.87–5.11)
RDW: 12.9 % (ref 11.5–15.5)
WBC: 14.3 10*3/uL — ABNORMAL HIGH (ref 4.0–10.5)
nRBC: 0 % (ref 0.0–0.2)

## 2021-10-23 LAB — TYPE AND SCREEN
ABO/RH(D): A POS
Antibody Screen: NEGATIVE

## 2021-10-23 LAB — POCT FERN TEST: POCT Fern Test: POSITIVE

## 2021-10-23 LAB — RPR: RPR Ser Ql: NONREACTIVE

## 2021-10-23 MED ORDER — EPHEDRINE 5 MG/ML INJ: 10.0000 mg | INTRAVENOUS | Status: DC | PRN

## 2021-10-23 MED ORDER — OXYTOCIN-SODIUM CHLORIDE 30-0.9 UT/500ML-% IV SOLN
1.0000 m[IU]/min | INTRAVENOUS | Status: DC
  Administered 2021-10-23 – 2021-10-24 (×2): 2 m[IU]/min via INTRAVENOUS
  Filled 2021-10-23 (×2): qty 500

## 2021-10-23 MED ORDER — LACTATED RINGERS IV SOLN: 500.0000 mL | Freq: Once | INTRAVENOUS | Status: DC

## 2021-10-23 MED ORDER — LACTATED RINGERS IV SOLN: INTRAVENOUS | Status: DC

## 2021-10-23 MED ORDER — OXYTOCIN-SODIUM CHLORIDE 30-0.9 UT/500ML-% IV SOLN
2.5000 [IU]/h | INTRAVENOUS | Status: DC
  Administered 2021-10-24: 2.5 [IU]/h via INTRAVENOUS

## 2021-10-23 MED ORDER — ACYCLOVIR 400 MG PO TABS
400.0000 mg | ORAL_TABLET | Freq: Three times a day (TID) | ORAL | Status: DC
  Administered 2021-10-23: 400 mg via ORAL
  Filled 2021-10-23: qty 1

## 2021-10-23 MED ORDER — LIDOCAINE-EPINEPHRINE (PF) 2 %-1:200000 IJ SOLN
INTRAMUSCULAR | Status: DC | PRN
  Administered 2021-10-23 – 2021-10-24 (×3): 5 mL via EPIDURAL
  Administered 2021-10-24: 10 mL via EPIDURAL

## 2021-10-23 MED ORDER — TERBUTALINE SULFATE 1 MG/ML IJ SOLN: 0.2500 mg | Freq: Once | INTRAMUSCULAR | Status: DC | PRN

## 2021-10-23 MED ORDER — LACTATED RINGERS IV SOLN
500.0000 mL | INTRAVENOUS | Status: DC | PRN
  Administered 2021-10-23: 1000 mL via INTRAVENOUS

## 2021-10-23 MED ORDER — DIPHENHYDRAMINE HCL 50 MG/ML IJ SOLN
12.5000 mg | INTRAMUSCULAR | Status: DC | PRN
  Administered 2021-10-24: 12.5 mg via INTRAVENOUS
  Filled 2021-10-23: qty 1

## 2021-10-23 MED ORDER — OXYCODONE-ACETAMINOPHEN 5-325 MG PO TABS: 2.0000 | ORAL_TABLET | ORAL | Status: DC | PRN

## 2021-10-23 MED ORDER — FENTANYL CITRATE (PF) 100 MCG/2ML IJ SOLN
50.0000 ug | INTRAMUSCULAR | Status: DC | PRN
  Administered 2021-10-23 (×3): 100 ug via INTRAVENOUS
  Filled 2021-10-23 (×3): qty 2

## 2021-10-23 MED ORDER — ACETAMINOPHEN 325 MG PO TABS
650.0000 mg | ORAL_TABLET | ORAL | Status: DC | PRN
  Administered 2021-10-24: 650 mg via ORAL
  Filled 2021-10-23: qty 2

## 2021-10-23 MED ORDER — FENTANYL CITRATE (PF) 100 MCG/2ML IJ SOLN
INTRAMUSCULAR | Status: AC
  Filled 2021-10-23: qty 2

## 2021-10-23 MED ORDER — FENTANYL CITRATE (PF) 100 MCG/2ML IJ SOLN
INTRAMUSCULAR | Status: DC | PRN
  Administered 2021-10-23 – 2021-10-24 (×2): 100 ug via EPIDURAL

## 2021-10-23 MED ORDER — BUPIVACAINE HCL (PF) 0.25 % IJ SOLN
INTRAMUSCULAR | Status: DC | PRN
  Administered 2021-10-23 – 2021-10-24 (×2): 8 mL via EPIDURAL

## 2021-10-23 MED ORDER — PHENYLEPHRINE 80 MCG/ML (10ML) SYRINGE FOR IV PUSH (FOR BLOOD PRESSURE SUPPORT)
80.0000 ug | PREFILLED_SYRINGE | INTRAVENOUS | Status: DC | PRN
  Filled 2021-10-23: qty 10

## 2021-10-23 MED ORDER — ACYCLOVIR 400 MG PO TABS
400.0000 mg | ORAL_TABLET | Freq: Three times a day (TID) | ORAL | Status: DC
Start: 2021-10-23 — End: 2021-10-24
  Administered 2021-10-23 – 2021-10-24 (×2): 400 mg via ORAL
  Filled 2021-10-23 (×5): qty 1

## 2021-10-23 MED ORDER — PHENYLEPHRINE 80 MCG/ML (10ML) SYRINGE FOR IV PUSH (FOR BLOOD PRESSURE SUPPORT): 80.0000 ug | PREFILLED_SYRINGE | INTRAVENOUS | Status: DC | PRN

## 2021-10-23 MED ORDER — LIDOCAINE HCL (PF) 1 % IJ SOLN: 30.0000 mL | INTRAMUSCULAR | Status: DC | PRN

## 2021-10-23 MED ORDER — SOD CITRATE-CITRIC ACID 500-334 MG/5ML PO SOLN
30.0000 mL | ORAL | Status: DC | PRN
  Administered 2021-10-24: 30 mL via ORAL
  Filled 2021-10-23: qty 30

## 2021-10-23 MED ORDER — OXYCODONE-ACETAMINOPHEN 5-325 MG PO TABS: 1.0000 | ORAL_TABLET | ORAL | Status: DC | PRN

## 2021-10-23 MED ORDER — OXYTOCIN BOLUS FROM INFUSION: 333.0000 mL | Freq: Once | INTRAVENOUS | Status: DC

## 2021-10-23 MED ORDER — FENTANYL-BUPIVACAINE-NACL 0.5-0.125-0.9 MG/250ML-% EP SOLN
12.0000 mL/h | EPIDURAL | Status: DC | PRN
  Administered 2021-10-23 – 2021-10-24 (×2): 12 mL/h via EPIDURAL
  Filled 2021-10-23 (×2): qty 250

## 2021-10-23 MED ORDER — ONDANSETRON HCL 4 MG/2ML IJ SOLN
4.0000 mg | Freq: Four times a day (QID) | INTRAMUSCULAR | Status: DC | PRN
  Administered 2021-10-23 (×2): 4 mg via INTRAVENOUS
  Filled 2021-10-23 (×2): qty 2

## 2021-10-23 NOTE — MAU Note (Signed)
..  Mackenzie Elliott is a 26 y.o. at [redacted]w[redacted]d here in MAU reporting: leaking of clear fluid since 3:30am. Irregular contractions 7-17mins. +FM. Reports some bloody show.   Pain score: 5/10 Vitals:   10/23/21 0541  BP: 121/77  Pulse: 96  Resp: 16  Temp: 97.9 F (36.6 C)  SpO2: 99%      Lab orders placed from triage: fern

## 2021-10-23 NOTE — Progress Notes (Signed)
Went to bedisde to evaluate for progress of labor. Nurse had just performed SVE.  Vitals:   10/23/21 1802 10/23/21 1832  BP: 100/60 110/68  Pulse: 76 77  Resp:    Temp:    SpO2:     Dilation: 4 Effacement (%): 80 Cervical Position: Middle Station: -2 Presentation: Vertex Exam by:: lee  A/P: Labor progressing well. Continue pitocin.   Myrtie Hawk, DO FMOB Fellow, Faculty practice [facility], Center for [employer]

## 2021-10-23 NOTE — Anesthesia Procedure Notes (Signed)
Epidural Patient location during procedure: OB Start time: 10/23/2021 4:30 PM End time: 10/23/2021 4:40 PM  Staffing Anesthesiologist: Elmer Picker, MD Performed: anesthesiologist   Preanesthetic Checklist Completed: patient identified, IV checked, risks and benefits discussed, monitors and equipment checked, pre-op evaluation and timeout performed  Epidural Patient position: sitting Prep: DuraPrep and site prepped and draped Patient monitoring: continuous pulse ox, blood pressure, heart rate and cardiac monitor Approach: midline Location: L3-L4 Injection technique: LOR air  Needle:  Needle type: Tuohy  Needle gauge: 17 G Needle length: 9 cm Needle insertion depth: 4.5 cm Catheter type: closed end flexible Catheter size: 19 Gauge Catheter at skin depth: 10 cm Test dose: negative  Assessment Sensory level: T8 Events: blood not aspirated, injection not painful, no injection resistance, no paresthesia and negative IV test  Additional Notes Patient identified. Risks/Benefits/Options discussed with patient including but not limited to bleeding, infection, nerve damage, paralysis, failed block, incomplete pain control, headache, blood pressure changes, nausea, vomiting, reactions to medication both or allergic, itching and postpartum back pain. Confirmed with bedside nurse the patient's most recent platelet count. Confirmed with patient that they are not currently taking any anticoagulation, have any bleeding history or any family history of bleeding disorders. Patient expressed understanding and wished to proceed. All questions were answered. Sterile technique was used throughout the entire procedure. Please see nursing notes for vital signs. Test dose was given through epidural catheter and negative prior to continuing to dose epidural or start infusion. Warning signs of high block given to the patient including shortness of breath, tingling/numbness in hands, complete motor block,  or any concerning symptoms with instructions to call for help. Patient was given instructions on fall risk and not to get out of bed. All questions and concerns addressed with instructions to call with any issues or inadequate analgesia.  Reason for block:procedure for pain

## 2021-10-23 NOTE — H&P (Addendum)
OBSTETRIC ADMISSION HISTORY AND PHYSICAL  Mackenzie Elliott is a 26 y.o. female G1P0 with IUP at [redacted]w[redacted]d by 10 week [place] presenting for SROM at 0330 this AM. She reports +FMs, no VB, no blurry vision, headaches, peripheral edema, or RUQ pain.  She has been having irregular contractions.  She plans on breast feeding. She is undecided about her birth control plan postpartum.   She received her prenatal care at [facility].  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, on Acyclovir)  Past Medical History: Past Medical History:  Diagnosis Date   Anxiety    Herpes simplex    Lyme disease 2014   [place] spotted fever 2012    Past Surgical History: Past Surgical History:  Procedure Laterality Date   TONSILLECTOMY AND ADENOIDECTOMY     as a child   WISDOM TOOTH EXTRACTION  05/2017    Obstetrical History: OB History     Gravida  1   Para      Term      Preterm      AB      Living         SAB      IAB      Ectopic      Multiple      Live Births              Social History Social History   Socioeconomic History   Marital status: Single    Spouse name: Not on file   Number of children: 0   Years of education: grad school   Highest education level: Not on file  Occupational History    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, Counselling psychologist  Tobacco Use   Smoking status: Never   Smokeless tobacco: Never  Vaping Use   Vaping Use: Never used  Substance and Sexual Activity   Alcohol use: Not Currently    Alcohol/week: 1.0 standard drink of alcohol    Types: 1 Cans of beer per week   Drug use: Never   Sexual activity: Yes    Birth control/protection: None  Other Topics Concern   Not on file  Social History Narrative   Lives alone   Caffeine- coffee, 1 cup daily   Social Determinants of Health    Financial Resource Strain: Low Risk  (08/06/2021)   Overall Financial Resource Strain (CARDIA)    Difficulty of Paying Living Expenses: Not hard at all  Food Insecurity: No Food Insecurity (08/06/2021)   Hunger Vital Sign    Worried About Running Out of Food in the Last Year: Never true    Ran Out of Food in the Last Year: Never true  Transportation Needs: No Transportation Needs (08/06/2021)   PRAPARE - 10/06/2021 (Medical): No    Lack of Transportation (Non-Medical): No  Physical Activity: Sufficiently Active (08/06/2021)   Exercise Vital Sign    Days of Exercise per Week: 5 days    Minutes of Exercise per Session: 50 min  Stress: Stress Concern Present (08/06/2021)   10/06/2021 of Occupational Health - Occupational Stress Questionnaire    Feeling of Stress : To some extent  Social Connections: Unknown (08/06/2021)   Social Connection and Isolation Panel [NHANES]    Frequency of Communication with Friends and Family: More than three times a  week    Frequency of Social Gatherings with Friends and Family: Twice a week    Attends Religious Services: Never    Database administrator or Organizations: No    Attends Engineer, structural: Patient refused    Marital Status: Patient refused    Family History: Family History  Problem Relation Age of Onset   Lung cancer Father    Skin cancer Father    Healthy Sister    Healthy Sister    Healthy Brother    Breast cancer Maternal Grandmother    Lung cancer Paternal Grandmother    Lung cancer Paternal Grandfather     Allergies: Allergies  Allergen Reactions   Doxycycline Anaphylaxis    Medications Prior to Admission  Medication Sig Dispense Refill Last Dose   acyclovir (ZOVIRAX) 400 MG tablet Take 1 tablet (400 mg total) by mouth 3 (three) times daily. 90 tablet 3 10/22/2021   ferrous sulfate 325 (65 FE) MG tablet Take 1 tablet (325 mg total) by mouth every other day. 45 tablet 2 10/22/2021    Prenatal Vit-Fe Fumarate-FA (PRENATAL VITAMIN PO) Take by mouth.   10/22/2021   doxylamine, Sleep, (UNISOM) 25 MG tablet Take 25 mg by mouth at bedtime as needed.        Review of Systems  All systems reviewed and negative except as stated in HPI  Blood pressure 121/77, pulse 96, temperature 97.9 F (36.6 C), temperature source Oral, resp. rate 16, height 5\' 2"  (1.575 m), weight 78.3 kg, SpO2 99 %.  General appearance: alert, cooperative, and no distress Lungs: normal work of breathing on room air  Heart: normal rate, warm and well perfused  Abdomen: soft, non-tender, gravid  Pelvic: normal external genitalia, normal vagina and cervix, pooling of clear fluid present, no lesions noted  Extremities: normal ROM, no calf tenderness to palpation  Presentation: Cephalic confirmed by bedside  Fetal monitoring: Baseline 135 bpm, moderate variability, + accels, no decels  Uterine activity: Irregular contractions   Prenatal labs: ABO, Rh: A/Positive/-- (01/24 1205) Antibody: Negative (05/09 0835) Rubella: 1.82 (01/24 1205) RPR: Non Reactive (05/09 0835)  HBsAg: Negative (01/24 1205)  HIV: Non Reactive (05/09 0835)  GBS: Negative/-- (07/07 1330)  2 hr Glucola normal  Genetic screening - LR NIPS  Anatomy 11-09-1982 normal   Prenatal Transfer Tool  Maternal Diabetes: No Genetic Screening: Normal Maternal Ultrasounds/Referrals: Normal Fetal Ultrasounds or other Referrals:  None Maternal Substance Abuse:  No Significant Maternal Medications:  Acyclovir Significant Maternal Lab Results: Group B Strep negative  Results for orders placed or performed during the hospital encounter of 10/23/21 (from the past 24 hour(s))  POCT fern test   Collection Time: 10/23/21  5:45 AM  Result Value Ref Range   POCT Fern Test Positive = ruptured amniotic membanes     Patient Active Problem List   Diagnosis Date Noted   HSV-2 infection 09/10/2021   Supervision of normal first pregnancy 04/22/2021    Assessment/Plan:  Early Ord is a 26 y.o. G1P0 at [redacted]w[redacted]d here for SROM.   #Labor: Irregular contractions and pressure since SROM with clear fluid. Will plan for expectant management. Plan to reassess in 3-4 hours. Will augment as needed.  #Pain: PRN #FWB: Cat 1  #ID:  GBS neg #MOF: Breast  #MOC: Undecided   #Hx of HSV-2: SSE negative. No outbreaks since 2017 and no symptoms currently. Okay to proceed toward vaginal delivery. Will continue Acyclovir while in labor.   2018, MD  10/23/2021, 6:46 AM

## 2021-10-23 NOTE — Anesthesia Preprocedure Evaluation (Signed)
Anesthesia Evaluation  Patient identified by MRN, date of birth, ID band Patient awake    Reviewed: Allergy & Precautions, NPO status , Patient's Chart, lab work & pertinent test results  Airway Mallampati: II  TM Distance: >3 FB Neck ROM: Full    Dental no notable dental hx. (+) Teeth Intact, Dental Advisory Given   Pulmonary neg pulmonary ROS,    Pulmonary exam normal breath sounds clear to auscultation       Cardiovascular negative cardio ROS Normal cardiovascular exam Rhythm:Regular Rate:Normal     Neuro/Psych PSYCHIATRIC DISORDERS Anxiety negative neurological ROS     GI/Hepatic negative GI ROS, Neg liver ROS,   Endo/Other  negative endocrine ROS  Renal/GU negative Renal ROS  negative genitourinary   Musculoskeletal negative musculoskeletal ROS (+)   Abdominal   Peds  Hematology negative hematology ROS (+)   Anesthesia Other Findings Presents with SROM  Reproductive/Obstetrics (+) Pregnancy                             Anesthesia Physical Anesthesia Plan  ASA: 2  Anesthesia Plan: Epidural   Post-op Pain Management:    Induction:   PONV Risk Score and Plan: Treatment may vary due to age or medical condition  Airway Management Planned: Natural Airway  Additional Equipment:   Intra-op Plan:   Post-operative Plan:   Informed Consent: I have reviewed the patients History and Physical, chart, labs and discussed the procedure including the risks, benefits and alternatives for the proposed anesthesia with the patient or authorized representative who has indicated his/her understanding and acceptance.       Plan Discussed with: Anesthesiologist  Anesthesia Plan Comments: (Patient identified. Risks, benefits, options discussed with patient including but not limited to bleeding, infection, nerve damage, paralysis, failed block, incomplete pain control, headache, blood pressure  changes, nausea, vomiting, reactions to medication, itching, and post partum back pain. Confirmed with bedside nurse the patient's most recent platelet count. Confirmed with the patient that they are not taking any anticoagulation, have any bleeding history or any family history of bleeding disorders. Patient expressed understanding and wishes to proceed. All questions were answered. )        Anesthesia Quick Evaluation

## 2021-10-23 NOTE — Progress Notes (Signed)
Labor Progress Note Mackenzie Elliott is a 26 y.o. G1P0 at [redacted]w[redacted]d presented for SROM  S: Pt feeling more pelvic pressure  O:  BP 121/69   Pulse 90   Temp 97.9 F (36.6 C) (Oral)   Resp 16   Ht 5\' 2"  (1.575 m)   Wt 78.3 kg   SpO2 99%   BMI 31.57 kg/m  EFM: 145bpm/mod variability/+accels 15x15/ no decels  CVE: 3/50/-3   A&P: 26 y.o. G1P0 [redacted]w[redacted]d here for SROM.  #Labor: Progressing well. Start pitocin after eating, #Pain: PRN meds, epidural upon request #FWB: CAT 1 #GBS negative   [redacted]w[redacted]d [name], DO

## 2021-10-23 NOTE — Progress Notes (Signed)
Labor Progress Note Mackenzie Elliott is a 26 y.o. G1P0 at [redacted]w[redacted]d who presented for PROM.  S: Doing okay. Feeling more pressure in her lower abdomen, back, and hips. Reporting some nausea now as well. Receiving IV Zofran. Family at bedside.   O:  BP 119/83   Pulse 76   Temp 99.2 F (37.3 C) (Axillary)   Resp 16   Ht 5\' 2"  (1.575 m)   Wt 78.3 kg   SpO2 99%   BMI 31.57 kg/m   EFM: Baseline 135 bpm, moderate variability, + accels, early/variable decels  Toco: Every 1-4 minutes   CVE: Dilation: 4 Effacement (%): 80 Cervical Position: Middle Station: -3 Presentation: Vertex Exam by:: Dr. [email or web address]  A&P: 26 y.o. G1P0 [redacted]w[redacted]d   #Labor: SVE unchanged from prior. Contracting every 1-4 minutes, Pitocin currently at 18 milli-Units/min. Mild cervical swelling noted anteriorly. Fetal head feels asynclitic. Anesthesia at bedside to assist with pain control with epidural. Once more comfortable, will place to place IUPC to assess adequacy of contractions. Will continue to work on position changes to help with fetal descent into pelvis. Titrate Pitocin as needed. Will plan for dose of Benadryl in the setting of cervical swelling. Plan to reassess in 4-5 hours, sooner as needed.  #Pain: Epidural  #FWB: Cat 2 due to occasional variable decelerations with contractions. Reassuring variability and accels in between. Will continue to monitor closely.  #GBS negative  [redacted]w[redacted]d, MD 11:53 PM

## 2021-10-24 ENCOUNTER — Encounter (HOSPITAL_COMMUNITY): Payer: Self-pay | Admitting: Obstetrics and Gynecology

## 2021-10-24 ENCOUNTER — Encounter (HOSPITAL_COMMUNITY)
Admission: AD | Disposition: A | Discharge status: Home / Self Care | Payer: Self-pay | Source: Home / Self Care | Attending: Obstetrics and Gynecology

## 2021-10-24 ENCOUNTER — Other Ambulatory Visit: Payer: Self-pay

## 2021-10-24 DIAGNOSIS — O411230 Chorioamnionitis, third trimester, not applicable or unspecified: Secondary | ICD-10-CM

## 2021-10-24 DIAGNOSIS — Z3A39 39 weeks gestation of pregnancy: Secondary | ICD-10-CM

## 2021-10-24 DIAGNOSIS — F419 Anxiety disorder, unspecified: Secondary | ICD-10-CM

## 2021-10-24 DIAGNOSIS — O99344 Other mental disorders complicating childbirth: Secondary | ICD-10-CM

## 2021-10-24 SURGERY — Surgical Case
Anesthesia: Epidural

## 2021-10-24 MED ORDER — ACETAMINOPHEN 10 MG/ML IV SOLN
1000.0000 mg | Freq: Once | INTRAVENOUS | Status: DC | PRN
  Administered 2021-10-24: 1000 mg via INTRAVENOUS

## 2021-10-24 MED ORDER — PHENYLEPHRINE 80 MCG/ML (10ML) SYRINGE FOR IV PUSH (FOR BLOOD PRESSURE SUPPORT)
PREFILLED_SYRINGE | INTRAVENOUS | Status: DC | PRN
  Administered 2021-10-24 (×3): 80 ug via INTRAVENOUS
  Administered 2021-10-24 (×2): 160 ug via INTRAVENOUS

## 2021-10-24 MED ORDER — ACETAMINOPHEN 10 MG/ML IV SOLN
INTRAVENOUS | Status: AC
  Filled 2021-10-24: qty 100

## 2021-10-24 MED ORDER — WITCH HAZEL-GLYCERIN EX PADS: 1.0000 | MEDICATED_PAD | CUTANEOUS | Status: DC | PRN

## 2021-10-24 MED ORDER — MORPHINE SULFATE (PF) 0.5 MG/ML IJ SOLN
INTRAMUSCULAR | Status: AC
  Filled 2021-10-24: qty 10

## 2021-10-24 MED ORDER — MEDROXYPROGESTERONE ACETATE 150 MG/ML IM SUSP: 150.0000 mg | INTRAMUSCULAR | Status: DC | PRN

## 2021-10-24 MED ORDER — DIPHENHYDRAMINE HCL 25 MG PO CAPS: 25.0000 mg | ORAL_CAPSULE | Freq: Four times a day (QID) | ORAL | Status: DC | PRN

## 2021-10-24 MED ORDER — KETOROLAC TROMETHAMINE 30 MG/ML IJ SOLN
30.0000 mg | Freq: Four times a day (QID) | INTRAMUSCULAR | Status: AC
  Administered 2021-10-24 – 2021-10-25 (×3): 30 mg via INTRAVENOUS
  Filled 2021-10-24 (×3): qty 1

## 2021-10-24 MED ORDER — DIPHENHYDRAMINE HCL 25 MG PO CAPS: 25.0000 mg | ORAL_CAPSULE | ORAL | Status: DC | PRN

## 2021-10-24 MED ORDER — PHENYLEPHRINE HCL-NACL 20-0.9 MG/250ML-% IV SOLN
INTRAVENOUS | Status: AC
  Filled 2021-10-24: qty 250

## 2021-10-24 MED ORDER — FENTANYL CITRATE (PF) 100 MCG/2ML IJ SOLN
25.0000 ug | INTRAMUSCULAR | Status: DC | PRN
  Administered 2021-10-24: 25 ug via INTRAVENOUS

## 2021-10-24 MED ORDER — NALOXONE HCL 0.4 MG/ML IJ SOLN: 0.4000 mg | INTRAMUSCULAR | Status: DC | PRN

## 2021-10-24 MED ORDER — LACTATED RINGERS IV SOLN: INTRAVENOUS | Status: DC | PRN

## 2021-10-24 MED ORDER — ONDANSETRON HCL 4 MG/2ML IJ SOLN
INTRAMUSCULAR | Status: DC | PRN
  Administered 2021-10-24: 4 mg via INTRAVENOUS

## 2021-10-24 MED ORDER — KETOROLAC TROMETHAMINE 30 MG/ML IJ SOLN: 30.0000 mg | Freq: Four times a day (QID) | INTRAMUSCULAR | Status: DC | PRN

## 2021-10-24 MED ORDER — DIBUCAINE (PERIANAL) 1 % EX OINT: 1.0000 | TOPICAL_OINTMENT | CUTANEOUS | Status: DC | PRN

## 2021-10-24 MED ORDER — ONDANSETRON HCL 4 MG/2ML IJ SOLN: 4.0000 mg | Freq: Three times a day (TID) | INTRAMUSCULAR | Status: DC | PRN

## 2021-10-24 MED ORDER — OXYCODONE HCL 5 MG/5ML PO SOLN: 5.0000 mg | Freq: Once | ORAL | Status: DC | PRN

## 2021-10-24 MED ORDER — AMISULPRIDE (ANTIEMETIC) 5 MG/2ML IV SOLN: 10.0000 mg | Freq: Once | INTRAVENOUS | Status: DC | PRN

## 2021-10-24 MED ORDER — PRENATAL MULTIVITAMIN CH
1.0000 | ORAL_TABLET | Freq: Every day | ORAL | Status: DC
  Administered 2021-10-25 – 2021-10-26 (×2): 1 via ORAL
  Filled 2021-10-24 (×2): qty 1

## 2021-10-24 MED ORDER — KETOROLAC TROMETHAMINE 30 MG/ML IJ SOLN: 30.0000 mg | Freq: Once | INTRAMUSCULAR | Status: DC

## 2021-10-24 MED ORDER — SODIUM CHLORIDE 0.9 % IV SOLN
2.0000 g | Freq: Four times a day (QID) | INTRAVENOUS | Status: DC
  Administered 2021-10-24: 2 g via INTRAVENOUS
  Filled 2021-10-24: qty 2000

## 2021-10-24 MED ORDER — ENOXAPARIN SODIUM 40 MG/0.4ML IJ SOSY
40.0000 mg | PREFILLED_SYRINGE | INTRAMUSCULAR | Status: DC
Start: 2021-10-25 — End: 2021-10-26
  Administered 2021-10-25 – 2021-10-26 (×2): 40 mg via SUBCUTANEOUS
  Filled 2021-10-24 (×2): qty 0.4

## 2021-10-24 MED ORDER — SODIUM CHLORIDE 0.9 % IV SOLN
500.0000 mg | INTRAVENOUS | Status: AC
Start: 2021-10-24 — End: 2021-10-24
  Administered 2021-10-24: 500 mg via INTRAVENOUS

## 2021-10-24 MED ORDER — TRANEXAMIC ACID-NACL 1000-0.7 MG/100ML-% IV SOLN
INTRAVENOUS | Status: DC | PRN
  Administered 2021-10-24: 1000 mg via INTRAVENOUS

## 2021-10-24 MED ORDER — CHLORHEXIDINE GLUCONATE 0.12 % MT SOLN: 15.0000 mL | Freq: Once | OROMUCOSAL | Status: DC

## 2021-10-24 MED ORDER — DIPHENHYDRAMINE HCL 50 MG/ML IJ SOLN: 12.5000 mg | INTRAMUSCULAR | Status: DC | PRN

## 2021-10-24 MED ORDER — MIDAZOLAM HCL 2 MG/2ML IJ SOLN
INTRAMUSCULAR | Status: DC | PRN
  Administered 2021-10-24: 2 mg via INTRAVENOUS

## 2021-10-24 MED ORDER — MIDAZOLAM HCL 2 MG/2ML IJ SOLN
INTRAMUSCULAR | Status: AC
  Filled 2021-10-24: qty 2

## 2021-10-24 MED ORDER — PROPOFOL 10 MG/ML IV BOLUS
INTRAVENOUS | Status: AC
  Filled 2021-10-24: qty 20

## 2021-10-24 MED ORDER — IBUPROFEN 600 MG PO TABS
600.0000 mg | ORAL_TABLET | Freq: Four times a day (QID) | ORAL | Status: DC
  Administered 2021-10-25 – 2021-10-26 (×4): 600 mg via ORAL
  Filled 2021-10-24 (×4): qty 1

## 2021-10-24 MED ORDER — ZOLPIDEM TARTRATE 5 MG PO TABS: 5.0000 mg | ORAL_TABLET | Freq: Every evening | ORAL | Status: DC | PRN

## 2021-10-24 MED ORDER — DOXYLAMINE SUCCINATE (SLEEP) 25 MG PO TABS
25.0000 mg | ORAL_TABLET | Freq: Every evening | ORAL | Status: DC | PRN
  Administered 2021-10-24: 25 mg via ORAL
  Filled 2021-10-24: qty 1

## 2021-10-24 MED ORDER — SUCCINYLCHOLINE CHLORIDE 200 MG/10ML IV SOSY
PREFILLED_SYRINGE | INTRAVENOUS | Status: DC | PRN
  Administered 2021-10-24: 160 mg via INTRAVENOUS

## 2021-10-24 MED ORDER — FENTANYL CITRATE (PF) 250 MCG/5ML IJ SOLN
INTRAMUSCULAR | Status: AC
  Filled 2021-10-24: qty 5

## 2021-10-24 MED ORDER — OXYCODONE HCL 5 MG PO TABS: 5.0000 mg | ORAL_TABLET | Freq: Once | ORAL | Status: DC | PRN

## 2021-10-24 MED ORDER — FENTANYL CITRATE (PF) 100 MCG/2ML IJ SOLN
INTRAMUSCULAR | Status: AC
  Filled 2021-10-24: qty 2

## 2021-10-24 MED ORDER — MORPHINE SULFATE (PF) 0.5 MG/ML IJ SOLN
INTRAMUSCULAR | Status: DC | PRN
  Administered 2021-10-24: 3 mg via EPIDURAL

## 2021-10-24 MED ORDER — SODIUM CHLORIDE 0.9 % IR SOLN
Status: DC | PRN
  Administered 2021-10-24: 1

## 2021-10-24 MED ORDER — ACETAMINOPHEN 500 MG PO TABS
1000.0000 mg | ORAL_TABLET | Freq: Four times a day (QID) | ORAL | Status: DC
  Administered 2021-10-24 – 2021-10-26 (×7): 1000 mg via ORAL
  Filled 2021-10-24 (×7): qty 2

## 2021-10-24 MED ORDER — ACETAMINOPHEN 500 MG PO TABS: 1000.0000 mg | ORAL_TABLET | Freq: Four times a day (QID) | ORAL | Status: DC

## 2021-10-24 MED ORDER — CEFAZOLIN SODIUM-DEXTROSE 2-4 GM/100ML-% IV SOLN
INTRAVENOUS | Status: AC
  Filled 2021-10-24: qty 100

## 2021-10-24 MED ORDER — GENTAMICIN SULFATE 40 MG/ML IJ SOLN
5.0000 mg/kg | INTRAMUSCULAR | Status: DC
  Administered 2021-10-24: 310 mg via INTRAVENOUS
  Filled 2021-10-24: qty 7.75

## 2021-10-24 MED ORDER — CEFAZOLIN SODIUM-DEXTROSE 2-4 GM/100ML-% IV SOLN: 2.0000 g | INTRAVENOUS | Status: DC

## 2021-10-24 MED ORDER — GABAPENTIN 100 MG PO CAPS
200.0000 mg | ORAL_CAPSULE | Freq: Every day | ORAL | Status: DC
Start: 2021-10-24 — End: 2021-10-26
  Administered 2021-10-24 – 2021-10-26 (×2): 200 mg via ORAL
  Filled 2021-10-24 (×2): qty 2

## 2021-10-24 MED ORDER — SODIUM CHLORIDE 0.9 % IV SOLN
INTRAVENOUS | Status: AC
  Filled 2021-10-24: qty 5

## 2021-10-24 MED ORDER — OXYTOCIN-SODIUM CHLORIDE 30-0.9 UT/500ML-% IV SOLN: 2.5000 [IU]/h | INTRAVENOUS | Status: AC

## 2021-10-24 MED ORDER — MEPERIDINE HCL 25 MG/ML IJ SOLN: 6.2500 mg | INTRAMUSCULAR | Status: DC | PRN

## 2021-10-24 MED ORDER — SCOPOLAMINE 1 MG/3DAYS TD PT72: 1.0000 | MEDICATED_PATCH | Freq: Once | TRANSDERMAL | Status: DC

## 2021-10-24 MED ORDER — SENNOSIDES-DOCUSATE SODIUM 8.6-50 MG PO TABS
2.0000 | ORAL_TABLET | Freq: Every day | ORAL | Status: DC
  Administered 2021-10-25 – 2021-10-26 (×2): 2 via ORAL
  Filled 2021-10-24 (×2): qty 2

## 2021-10-24 MED ORDER — LIDOCAINE-EPINEPHRINE (PF) 2 %-1:200000 IJ SOLN
INTRAMUSCULAR | Status: AC
  Filled 2021-10-24: qty 20

## 2021-10-24 MED ORDER — SIMETHICONE 80 MG PO CHEW: 80.0000 mg | CHEWABLE_TABLET | ORAL | Status: DC | PRN

## 2021-10-24 MED ORDER — PHENYLEPHRINE HCL-NACL 20-0.9 MG/250ML-% IV SOLN
INTRAVENOUS | Status: DC | PRN
  Administered 2021-10-24: 60 ug/min via INTRAVENOUS

## 2021-10-24 MED ORDER — SODIUM CHLORIDE 0.9% FLUSH: 3.0000 mL | INTRAVENOUS | Status: DC | PRN

## 2021-10-24 MED ORDER — KETOROLAC TROMETHAMINE 30 MG/ML IJ SOLN
INTRAMUSCULAR | Status: AC
  Filled 2021-10-24: qty 1

## 2021-10-24 MED ORDER — TRANEXAMIC ACID-NACL 1000-0.7 MG/100ML-% IV SOLN
INTRAVENOUS | Status: AC
  Filled 2021-10-24: qty 100

## 2021-10-24 MED ORDER — SODIUM CHLORIDE 0.9 % IV SOLN
2.0000 g | Freq: Four times a day (QID) | INTRAVENOUS | Status: AC
  Administered 2021-10-24 – 2021-10-25 (×3): 2 g via INTRAVENOUS
  Filled 2021-10-24 (×3): qty 2000

## 2021-10-24 MED ORDER — STERILE WATER FOR IRRIGATION IR SOLN
Status: DC | PRN
  Administered 2021-10-24: 1000 mL

## 2021-10-24 MED ORDER — PROPOFOL 10 MG/ML IV BOLUS
INTRAVENOUS | Status: DC | PRN
  Administered 2021-10-24: 50 ug via INTRAVENOUS
  Administered 2021-10-24: 200 ug via INTRAVENOUS
  Administered 2021-10-24: 50 ug via INTRAVENOUS

## 2021-10-24 MED ORDER — PROMETHAZINE HCL 25 MG/ML IJ SOLN: 6.2500 mg | INTRAMUSCULAR | Status: DC | PRN

## 2021-10-24 MED ORDER — ORAL CARE MOUTH RINSE: 15.0000 mL | Freq: Once | OROMUCOSAL | Status: DC

## 2021-10-24 MED ORDER — OXYTOCIN-SODIUM CHLORIDE 30-0.9 UT/500ML-% IV SOLN
INTRAVENOUS | Status: AC
  Filled 2021-10-24: qty 500

## 2021-10-24 MED ORDER — SIMETHICONE 80 MG PO CHEW
80.0000 mg | CHEWABLE_TABLET | Freq: Three times a day (TID) | ORAL | Status: DC
  Administered 2021-10-25 – 2021-10-26 (×4): 80 mg via ORAL
  Filled 2021-10-24 (×4): qty 1

## 2021-10-24 MED ORDER — NALOXONE HCL 4 MG/10ML IJ SOLN: 1.0000 ug/kg/h | INTRAVENOUS | Status: DC | PRN

## 2021-10-24 MED ORDER — CEFAZOLIN SODIUM-DEXTROSE 2-3 GM-%(50ML) IV SOLR
INTRAVENOUS | Status: DC | PRN
  Administered 2021-10-24: 2 g via INTRAVENOUS

## 2021-10-24 MED ORDER — SOD CITRATE-CITRIC ACID 500-334 MG/5ML PO SOLN: 30.0000 mL | ORAL | Status: DC

## 2021-10-24 MED ORDER — MENTHOL 3 MG MT LOZG
1.0000 | LOZENGE | OROMUCOSAL | Status: DC | PRN
  Administered 2021-10-25: 3 mg via ORAL
  Filled 2021-10-24: qty 9

## 2021-10-24 MED ORDER — FENTANYL CITRATE (PF) 250 MCG/5ML IJ SOLN
INTRAMUSCULAR | Status: DC | PRN
  Administered 2021-10-24: 50 ug via INTRAVENOUS
  Administered 2021-10-24: 100 ug via INTRAVENOUS
  Administered 2021-10-24: 50 ug via INTRAVENOUS

## 2021-10-24 MED ORDER — KETOROLAC TROMETHAMINE 30 MG/ML IJ SOLN
30.0000 mg | Freq: Four times a day (QID) | INTRAMUSCULAR | Status: DC | PRN
  Administered 2021-10-24: 30 mg via INTRAMUSCULAR

## 2021-10-24 MED ORDER — LACTATED RINGERS IV SOLN
INTRAVENOUS | Status: DC
Start: 2021-10-24 — End: 2021-10-24

## 2021-10-24 MED ORDER — TETANUS-DIPHTH-ACELL PERTUSSIS 5-2.5-18.5 LF-MCG/0.5 IM SUSY: 0.5000 mL | PREFILLED_SYRINGE | Freq: Once | INTRAMUSCULAR | Status: DC

## 2021-10-24 MED ORDER — COCONUT OIL OIL: 1.0000 | TOPICAL_OIL | Status: DC | PRN

## 2021-10-24 MED ORDER — DEXAMETHASONE SODIUM PHOSPHATE 10 MG/ML IJ SOLN
INTRAMUSCULAR | Status: DC | PRN
  Administered 2021-10-24: 10 mg via INTRAVENOUS

## 2021-10-24 SURGICAL SUPPLY — 31 items
CHLORAPREP W/TINT 26ML (MISCELLANEOUS) ×4 IMPLANT
CLAMP CORD UMBIL (MISCELLANEOUS) ×2 IMPLANT
DERMABOND ADVANCED (GAUZE/BANDAGES/DRESSINGS) ×1
DERMABOND ADVANCED .7 DNX12 (GAUZE/BANDAGES/DRESSINGS) IMPLANT
DRSG OPSITE POSTOP 4X10 (GAUZE/BANDAGES/DRESSINGS) ×2 IMPLANT
ELECT REM PT RETURN 9FT ADLT (ELECTROSURGICAL) ×2
ELECTRODE REM PT RTRN 9FT ADLT (ELECTROSURGICAL) ×1 IMPLANT
EXTRACTOR VACUUM M CUP 4 TUBE (SUCTIONS) IMPLANT
GAUZE SPONGE 4X4 12PLY STRL LF (GAUZE/BANDAGES/DRESSINGS) ×1 IMPLANT
GLOVE BIOGEL PI IND STRL 6.5 (GLOVE) ×1 IMPLANT
GLOVE BIOGEL PI IND STRL 7.0 (GLOVE) ×1 IMPLANT
GLOVE BIOGEL PI INDICATOR 6.5 (GLOVE) ×1
GLOVE BIOGEL PI INDICATOR 7.0 (GLOVE) ×1
GLOVE SURG SS PI 6.5 STRL IVOR (GLOVE) ×2 IMPLANT
GOWN STRL REUS W/TWL LRG LVL3 (GOWN DISPOSABLE) ×4 IMPLANT
KIT ABG SYR 3ML LUER SLIP (SYRINGE) IMPLANT
NDL HYPO 25X5/8 SAFETYGLIDE (NEEDLE) IMPLANT
NEEDLE HYPO 25X5/8 SAFETYGLIDE (NEEDLE) IMPLANT
NS IRRIG 1000ML POUR BTL (IV SOLUTION) ×2 IMPLANT
PACK C SECTION WH (CUSTOM PROCEDURE TRAY) ×2 IMPLANT
PAD ABD DERMACEA PRESS 5X9 (GAUZE/BANDAGES/DRESSINGS) ×1 IMPLANT
PAD OB MATERNITY 4.3X12.25 (PERSONAL CARE ITEMS) ×2 IMPLANT
RTRCTR C-SECT PINK 25CM LRG (MISCELLANEOUS) IMPLANT
SUT PLAIN 0 NONE (SUTURE) IMPLANT
SUT PLAIN 2 0 (SUTURE) ×1
SUT PLAIN ABS 2-0 CT1 27XMFL (SUTURE) IMPLANT
SUT VIC AB 0 CT1 36 (SUTURE) ×8 IMPLANT
SUT VIC AB 4-0 KS 27 (SUTURE) ×2 IMPLANT
TOWEL OR 17X24 6PK STRL BLUE (TOWEL DISPOSABLE) ×2 IMPLANT
TRAY FOLEY W/BAG SLVR 14FR LF (SET/KITS/TRAYS/PACK) ×2 IMPLANT
WATER STERILE IRR 1000ML POUR (IV SOLUTION) ×2 IMPLANT

## 2021-10-24 NOTE — Transfer of Care (Signed)
Immediate Anesthesia Transfer of Care Note  Patient: Mackenzie Elliott  Procedure(s) Performed: CESAREAN SECTION  Patient Location: PACU  Anesthesia Type:General and Epidural  Level of Consciousness: drowsy  Airway & Oxygen Therapy: Patient Spontanous Breathing and Patient connected to nasal cannula oxygen  Post-op Assessment: Report given to RN and Post -op Vital signs reviewed and stable  Post vital signs: Reviewed and stable  Last Vitals:  Vitals Value Taken Time  BP 98/70 10/24/21 1653  Temp    Pulse 108 10/24/21 1657  Resp 18 10/24/21 1657  SpO2 100 % 10/24/21 1657  Vitals shown include unvalidated device data.  Last Pain:  Vitals:   10/24/21 1339  TempSrc: Oral  PainSc: 0-No pain      Patients Stated Pain Goal: 0 (10/23/21 0536)  Complications: No notable events documented.

## 2021-10-24 NOTE — Anesthesia Procedure Notes (Signed)
Procedure Name: Intubation Date/Time: 10/24/2021 3:42 PM  Performed by: Renford Dills, CRNAPre-anesthesia Checklist: Patient identified, Emergency Drugs available, Suction available, Patient being monitored and Timeout performed Patient Re-evaluated:Patient Re-evaluated prior to induction Oxygen Delivery Method: Circle system utilized Preoxygenation: Pre-oxygenation with 100% oxygen Induction Type: IV induction, Rapid sequence and Cricoid Pressure applied Laryngoscope Size: Glidescope and 3 Grade View: Grade I Tube type: Oral Tube size: 7.0 mm Number of attempts: 1 Airway Equipment and Method: Stylet and Video-laryngoscopy Placement Confirmation: ETT inserted through vocal cords under direct vision, breath sounds checked- equal and bilateral and positive ETCO2 Secured at: 21 cm Tube secured with: Tape Dental Injury: Teeth and Oropharynx as per pre-operative assessment

## 2021-10-24 NOTE — Progress Notes (Signed)
Labor Progress Note   Evaluated patient at bedside. Reports she is tired but doing well with recent epidural bolus, still having some pain with movements.   Checked cervix, unchanged from previous earlier this am by RN (6.5/80/-2). Anterior portion of cervix is slightly swollen with fetal caput. Unable to feel suture lines very well and head feels asynclitic-- attempted bedside US without showing clear OP. Vaginal canal did not feel warm, but had temp of 100.62F immediately prior to exam.   She has made slow but steady change overnight with pit, but now unchanged in the past several hours. Pit at 20 and has been on for 24 hours.  Plan for short pit break (About 2 hours) and then restart--- with caveat that FHT continues to be reassuring (currently 150/mod/15x15/occasional early or small variable). Will place an IUPC after pit restart. Give IV benadryl for small amount of cervical swelling. Start amp/gent for presumed chorioamnionitis beginning (RN reported new odor to amniotic fluid + fever and prolonged ROM). Dr. Jolayne Panther made aware and comfortable with plan as well.   Patient is aware that after the pit break if her cervix continues not to progress that we would discuss CS for arrest of dilation.   Allayne Stack, DO

## 2021-10-24 NOTE — Op Note (Signed)
Seda Kronberg PROCEDURE DATE: 10/23/2021 - 10/24/2021  PREOPERATIVE DIAGNOSIS: Intrauterine pregnancy at  [redacted]w[redacted]d weeks gestation; failure to progress: arrest of dilation  POSTOPERATIVE DIAGNOSIS: The same  PROCEDURE:     Cesarean Section  SURGEON:  Dr. Catalina Antigua  ASSISTANT: Dr. Annia Friendly and Dr. Ladon Applebaum  INDICATIONS: Mackenzie Elliott is a 26 y.o. G1P1001 at [redacted]w[redacted]d scheduled for cesarean section secondary to failure to progress: arrest of dilation.  The risks of cesarean section discussed with the patient included but were not limited to: bleeding which may require transfusion or reoperation; infection which may require antibiotics; injury to bowel, bladder, ureters or other surrounding organs; injury to the fetus; need for additional procedures including hysterectomy in the event of a life-threatening hemorrhage; placental abnormalities wth subsequent pregnancies, incisional problems, thromboembolic phenomenon and other postoperative/anesthesia complications. The patient concurred with the proposed plan, giving informed written consent for the procedure.    FINDINGS:  Viable female infant in cephalic presentation.  Apgars and Weigh not available at time of note  Clear amniotic fluid.  Intact placenta, three vessel cord.  Normal uterus, fallopian tubes and ovaries bilaterally.  ANESTHESIA:    Epidural converted to general anesthesia INTRAVENOUS FLUIDS:1400 ml ESTIMATED BLOOD LOSS: 646 ml URINE OUTPUT:  200 ml SPECIMENS: Placenta sent to L&D COMPLICATIONS: None immediate  PROCEDURE IN DETAIL:  The patient received intravenous antibiotics and had sequential compression devices applied to her lower extremities while in the preoperative area.  She was then taken to the operating room where anesthesia was induced and was found to be adequate. A foley catheter was placed into her bladder and attached to Fawaz Borquez gravity. She was then placed in a dorsal supine position with a leftward tilt, and prepped  and draped in a sterile manner. After an adequate timeout was performed, a Pfannenstiel skin incision was made with scalpel and carried through to the underlying layer of fascia. The fascia was incised in the midline and this incision was extended bluntly. The rectus muscles were separated in the midline bluntly and the peritoneum was entered bluntly. The Alexis self-retaining retractor was introduced into the abdominal cavity. Attention was turned to the lower uterine segment where a bladder flap was created, and a transverse hysterotomy was made with a scalpel and extended bilaterally bluntly. The infant was successfully delivered, and cord was clamped and cut and infant was handed over to awaiting neonatology team. Uterine massage was then administered and the placenta delivered intact with three-vessel cord. The uterus was cleared of clot and debris.  The hysterotomy was closed with 0 Vicryl in a running locked fashion, and an imbricating layer was also placed with a 0 Vicryl. Overall, excellent hemostasis was noted. The pelvis copiously irrigated and cleared of all clot and debris. Hemostasis was confirmed on all surfaces.  The peritoneum and the muscles were reapproximated using 0 vicryl interrupted stitches. The fascia was then closed using 0 Vicryl in a running fashion.  The subcutaneous layer was reapproximated with plain gut and the skin was closed in a subcuticular fashion using 3.0 Vicryl. The patient tolerated the procedure well. Sponge, lap, instrument and needle counts were correct x 2. She was taken to the recovery room in stable condition.    Hamed Debella ConstantMD  10/24/2021 4:23 PM

## 2021-10-24 NOTE — Anesthesia Postprocedure Evaluation (Signed)
Anesthesia Post Note  Patient: Mackenzie Elliott  Procedure(s) Performed: CESAREAN SECTION     Patient location during evaluation: PACU Anesthesia Type: General and Epidural Level of consciousness: awake Pain management: pain level controlled Vital Signs Assessment: post-procedure vital signs reviewed and stable Respiratory status: spontaneous breathing and respiratory function stable Cardiovascular status: stable Postop Assessment: no apparent nausea or vomiting Anesthetic complications: no   No notable events documented.  Last Vitals:  Vitals:   10/24/21 1715 10/24/21 1730  BP: 106/74 106/77  Pulse: (!) 103 (!) 102  Resp: 14 16  Temp:    SpO2: 100% 99%    Last Pain:  Vitals:   10/24/21 1730  TempSrc:   PainSc: 0-No pain   Pain Goal: Patients Stated Pain Goal: 4 (10/24/21 1715)  LLE Motor Response: Purposeful movement (10/24/21 1730) LLE Sensation: Numbness, Tingling (10/24/21 1730) RLE Motor Response: Purposeful movement (10/24/21 1730) RLE Sensation: Numbness, Tingling (10/24/21 1730)     Epidural/Spinal Function Cutaneous sensation: Tingles (10/24/21 1730), Patient able to flex knees: Yes (10/24/21 1730), Patient able to lift hips off bed: No (10/24/21 1730), Back pain beyond tenderness at insertion site: No (10/24/21 1730), Progressively worsening motor and/or sensory loss: No (10/24/21 1730), Bowel and/or bladder incontinence post epidural: No (10/24/21 1730)  Mellody Dance

## 2021-10-24 NOTE — Progress Notes (Signed)
Labor Progress Note Mackenzie Elliott is a 26 y.o. G1P0 at [redacted]w[redacted]d who presented for PROM.   S: Doing well. Able to get some rest per RN. Continuing with position changes. No concerns.   O:  BP 115/79   Pulse 82   Temp 99.6 F (37.6 C) (Axillary)   Resp 16   Ht 5\' 2"  (1.575 m)   Wt 78.3 kg   SpO2 99%   BMI 31.57 kg/m   EFM: Baseline 135 bpm, moderate variability, + accels, no decels  Toco: Every 1-4 minutes   CVE: Dilation: 5.5 Effacement (%): 80 Cervical Position: Middle Station: -2 Presentation: Vertex Exam by:: [email or web address], RN  A&P: 26 y.o. G1P0 102w5d   #Labor: Progressing well. Pitocin remains at 18 milli-Units/min. Contracting every 1-4 minutes. Somewhat irregular but has progressed some on last check by RN. Will plan to reassess 4 hours from last exam, sooner as needed.  #Pain: Epidural #FWB: Cat 1  #GBS negative  [redacted]w[redacted]d, MD 5:13 AM

## 2021-10-24 NOTE — Progress Notes (Signed)
Attempted IUPC placement. Met resistance with attempt at threading catheter on both right, left, and posterior aspects. Will hold off on placement for now and continue to titrate Pitocin. Can consider re-attempt on next exam if unchanged.  Vilma Meckel, MD

## 2021-10-24 NOTE — Discharge Summary (Signed)
Postpartum Discharge Summary     Patient Name: Mackenzie Elliott DOB: Jun 12, 1995 MRN: [id number]  Date of admission: 10/23/2021 Delivery date:10/24/2021  Delivering provider: CONSTANT, PEGGY  Date of discharge: 10/26/2021  Admitting diagnosis: PROM (premature rupture of membranes) [O42.90] Intrauterine pregnancy: [redacted]w[redacted]d    Secondary diagnosis:  Principal Problem:   PROM (premature rupture of membranes) Active Problems:   Supervision of normal first pregnancy   HSV-2 infection   Chorioamnionitis  Additional problems: Intrapartum Intrauterine Infection     Arrest of Dilation in Labor    Discharge diagnosis: Term Pregnancy Delivered                                              Post partum procedures: none Augmentation: Pitocin Complications: Intrauterine Inflammation or infection (Chorioamniotis) and ROM>24 hours  Hospital course: Onset of Labor With Unplanned C/S   26y.o. yo G1P1001 at [id number] admitted following spontaneous rupture of membranes on 10/23/2021. Patient had a labor course significant for Prolonged ROM, Intrapartum Intrauterine infection, arrest of dilation. The patient went for cesarean section due to Arrest of Dilation. Delivery details as follows: Membrane Rupture Time/Date: 3:30 AM ,10/23/2021   Delivery Method:C-Section, Low Transverse  Details of operation can be found in separate operative note. Patient had an uncomplicated postpartum course. Her POD#1 Hgb was 9.1 (was 11.4 on admit) and she was given oral Fe. She is ambulating,tolerating a regular diet, passing flatus, and urinating well.  Patient is discharged home in stable condition 10/26/21.  Newborn Data: Birth date:10/24/2021  Birth time:3:45 PM  Gender:Female  Living status:Living  Apgars:8 ,9  Weight:3657 g (8lb 1oz)  Magnesium Sulfate received: No BMZ received: No Rhophylac:N/A MMR:N/A T-DaP:Given prenatally Flu: No Transfusion:No  Physical exam  Vitals:   10/25/21 1000 10/25/21 1505  10/25/21 2125 10/26/21 0546  BP: 102/64 100/70 94/68 114/78  Pulse: 87 91 73 73  Resp: _0 Temp: 97.6 F (36.4 C) (!) 97.5 F (36.4 C) (!) 97.5 F (36.4 C) (!) 97.3 F (36.3 C)  TempSrc: Oral Oral Oral Oral  SpO2:  98% 100%   Weight:      Height:       General: alert and cooperative Lochia: appropriate Uterine Fundus: firm Incision: honeycomb intact and dry DVT Evaluation: No evidence of DVT seen on physical exam. Labs: Lab Results  Component Value Date   WBC 23.7 (H) 10/25/2021   HGB 9.1 (L) 10/25/2021   HCT 26.8 (L) 10/25/2021   MCV 100.0 10/25/2021   PLT 187 10/25/2021      Latest Ref Rng & Units 04/30/2021    1:12 PM  CMP  Glucose 70 - 99 mg/dL 103   BUN 6 - 20 mg/dL 6   Creatinine 0.44 - 1.00 mg/dL 0.85   Sodium 135 - 145 mmol/L 133   Potassium 3.5 - 5.1 mmol/L 3.3   Chloride 98 - 111 mmol/L 102   CO2 22 - 32 mmol/L 21   Calcium 8.9 - 10.3 mg/dL 9.2   Total Protein 6.5 - 8.1 g/dL 6.8   Total Bilirubin 0.3 - 1.2 mg/dL 0.6   Alkaline Phos 38 - 126 U/L 51   AST 15 - 41 U/L 20   ALT 0 - 44 U/L 18    Edinburgh Score:    10/25/2021    4:27 PM  EdFlavia Shipperostnatal  Depression Scale Screening Tool  I have been able to laugh and see the funny side of things. 0  I have looked forward with enjoyment to things. 0  I have blamed myself unnecessarily when things went wrong. 2  I have been anxious or worried for no good reason. 1  I have felt scared or panicky for no good reason. 0  Things have been getting on top of me. 1  I have been so unhappy that I have had difficulty sleeping. 0  I have felt sad or miserable. 1  I have been so unhappy that I have been crying. 1  The thought of harming myself has occurred to me. 0  Edinburgh Postnatal Depression Scale Total 6     After visit meds:  Allergies as of 10/26/2021       Reactions   Doxycycline Anaphylaxis        Medication List     STOP taking these medications    acyclovir 400 MG  tablet Commonly known as: ZOVIRAX   doxylamine (Sleep) 25 MG tablet Commonly known as: UNISOM       TAKE these medications    ferrous sulfate 325 (65 FE) MG tablet Take 1 tablet (325 mg total) by mouth every other day.   ibuprofen 600 MG tablet Commonly known as: ADVIL Take 1 tablet (600 mg total) by mouth every 6 (six) hours as needed.   oxyCODONE 5 MG immediate release tablet Commonly known as: Oxy IR/ROXICODONE Take 1 tablet (5 mg total) by mouth every 4 (four) hours as needed for moderate pain.   PRENATAL VITAMIN PO Take by mouth.         Discharge home in stable condition Infant Feeding: Breast Infant Disposition:home with mother Discharge instruction: per After Visit Summary and Postpartum booklet. Activity: Advance as tolerated. Pelvic rest for 6 weeks.  Diet: routine diet Future Appointments: Future Appointments  Date Time Provider [place]  11/04/2021 11:50 AM Cresenzo-Dishmon, Joaquim Lai, CNM [facility]   Follow up Visit:   Please schedule this patient for a In person postpartum visit in 1 week with the following provider: MD. Additional Postpartum F/U:Incision check 1 week  Low risk pregnancy complicated by:  history of HSV Delivery mode:  C-Section, Low Transverse  Anticipated Birth Control:  Unsure   10/26/2021 Myrtis Ser, CNM 8:45 AM

## 2021-10-24 NOTE — Progress Notes (Signed)
Labor Progress Note   Restarted pit at 1338. Since approximately 1400, FHT now with minimal variability for the past 50 minutes beyond expected for a fetal sleep cycle. Still having occasional 15x15 accel, but persistent Cat II. No decels. Checked cervix and unchanged.   With new FHT changes, in the setting of prolonged ROM with concurrent chorioamnionitis and no cervical change since this morning-- recommended proceeding with a CS at this time for NRFHT and arrest of dilation. She is comfortable with proceeding.   The risks of cesarean section discussed with the patient included but were not limited to: bleeding which may require transfusion or reoperation; infection which may require antibiotics; injury to bowel, bladder, ureters or other surrounding organs; injury to the fetus; need for additional procedures including hysterectomy in the event of a life-threatening hemorrhage; placental abnormalities wth subsequent pregnancies, incisional problems, thromboembolic phenomenon and other postoperative/anesthesia complications. The patient concurred with the proposed plan, giving informed written consent for the procedure. Anesthesia and OR aware. Preoperative prophylactic antibiotics and SCDs ordered on call to the OR. To OR when ready.   Allayne Stack, DO

## 2021-10-24 NOTE — Lactation Note (Signed)
This note was copied from a baby's chart. Lactation Consultation Note  Patient Name: Mackenzie Elliott ZOXWR'[id number] Date: 10/24/2021 Reason for consult: Initial assessment;1st time breastfeeding;Term Age:26 hours, Term female infant. Infant had void diaper that LC changed while in room. P1, C/S delivery See Birth parent MR. Due to  Birth Parent being tired infant was supplemented with formula after recovery,  this will be infant's first time Chest Feeding. LC entered the room infant was cuing to breastfeed. LC observed Birth Parent's  breast anatomy see Latch Score below. LC asked Birth Parent to hand express a few drops of colostrum out prior to latching infant to help evert nipple shaft out more or pre-pump breast with hand pump. LC observed infant has short and thick band of tissue ( lingual frenulum) that tether's to the bottom of infant's tongue tip and floor of infant's mouth.  Birth Parent briefly hand expressed which evert nipple shaft out more and latched infant on the right breast using the football hold position, infant was on and off breast initially but started sustaining latch towards the end of the feeding, infant BF for 11 minutes.  Afterwards LC taught Birth Parent how to hand express using breast model and Birth Parent easily expressed 6 mls of colostrum that was spoon fed to infant. Mom made aware of O/P services, breastfeeding support groups, community resources, and our phone # for post-discharge questions.   Birth Parent's Current Feeding Plan: 1- Pre-pump breast with hand pump prior to latching infant at the breast, continue to BF infant according to hunger cues, 8 to 12+ times within 24 hrs, STS. 2- Ask RN/LC for further latch assistance if needed, If infant doesn't latch to hand express and give infant EBM by spoon.  Maternal Data Has patient been taught Hand Expression?: Yes  Feeding Mother's Current Feeding Choice: Breast Milk and Formula Nipple Type: Slow -  flow  LATCH Score Latch: Repeated attempts needed to sustain latch, nipple held in mouth throughout feeding, stimulation needed to elicit sucking reflex.  Audible Swallowing: A few with stimulation  Type of Nipple: Everted at rest and after stimulation (short shafted but nipple responds well to stimulation when hand expressing or pre-pumping with hand pump.)  Comfort (Breast/Nipple): Soft / non-tender  Hold (Positioning): Assistance needed to correctly position infant at breast and maintain latch.  LATCH Score: 7   Lactation Tools Discussed/Used    Interventions Interventions: Breast feeding basics reviewed;Skin to skin;Assisted with latch;Pre-pump if needed;Hand express;Breast compression;Adjust position;Support pillows;Position options;Expressed milk;Education;LC Services brochure;Hand pump  Discharge Pump: Personal (Mom Cozy DEBP at home.)  Consult Status Consult Status: Follow-up Date: 10/25/21 Follow-up type: In-patient    Danelle Earthly 10/24/2021, 10:06 PM

## 2021-10-25 ENCOUNTER — Encounter (HOSPITAL_COMMUNITY): Payer: Self-pay | Admitting: Obstetrics and Gynecology

## 2021-10-25 ENCOUNTER — Encounter: Payer: 59 | Admitting: Advanced Practice Midwife

## 2021-10-25 LAB — CBC
HCT: 26.8 % — ABNORMAL LOW (ref 36.0–46.0)
Hemoglobin: 9.1 g/dL — ABNORMAL LOW (ref 12.0–15.0)
MCH: 34 pg (ref 26.0–34.0)
MCHC: 34 g/dL (ref 30.0–36.0)
MCV: 100 fL (ref 80.0–100.0)
Platelets: 187 10*3/uL (ref 150–400)
RBC: 2.68 MIL/uL — ABNORMAL LOW (ref 3.87–5.11)
RDW: 13.2 % (ref 11.5–15.5)
WBC: 23.7 10*3/uL — ABNORMAL HIGH (ref 4.0–10.5)
nRBC: 0 % (ref 0.0–0.2)

## 2021-10-25 MED ORDER — FERROUS SULFATE 325 (65 FE) MG PO TABS
325.0000 mg | ORAL_TABLET | ORAL | Status: DC
  Administered 2021-10-25: 325 mg via ORAL
  Filled 2021-10-25: qty 1

## 2021-10-25 NOTE — Social Work (Signed)
CSW received consult for hx of Anxiety. CSW met with MOB to offer support and complete assessment.    CSW entered room, introduced CSW role and reason for visit. MOB was agreeable to visit. CSW offered privacy, MOB allowed FOB to remain in the room. CSW inquired about how MOB was feeling since giving birth and her birthing experience. MOB reported she was okay and the birthing experience was good for her. CSW inquired about MOB's noted Mental Health history. MOB reported she was anxious during pregnancy but never diagnosed with Anxiety. MOB reported she was anxious about the delivery. MOB reported she was prescribed Zoloft in February but she never took the medication. 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 MOB could not recall the name of the medication she was prescribed. CSW accessed for safety, Mob denied any SI or HI. MOB identified her supports to be FOB, her family, and friend group.  CSW provided education regarding the baby blues period vs. perinatal mood disorders, discussed treatment and gave resources for mental health follow up if concerns arise.  CSW recommends self-evaluation during the postpartum time period using the New Mom Checklist from Postpartum Progress and encouraged MOB to contact a medical professional if symptoms are noted at any time.    CSW provided review of Sudden Infant Death Syndrome (SIDS) precautions. MOB identified Dr. Noonan with [facility] fr infants follow up care. MOB reported they have all necessary ites of the infant including a bassinet for safe sleep.     CSW identifies no further need for intervention and no barriers to discharge at this time.   Ronn Smolinsky, LCSWA Clinical Social Worker [phone]  

## 2021-10-25 NOTE — Progress Notes (Signed)
Post-Op Day 1, primary CS for NRFHT, AoD,Triple I  Subjective: No complaints, up ad lib, voiding and tolerating PO, passing flatus,small lochia,.plans to breastfeed,   Objective: Blood pressure (!) 95/58, pulse 94, temperature 97.7 F (36.5 C), temperature source Oral, resp. rate 18, height 5\' 2"  (1.575 m), weight 78.3 kg, SpO2 95 %, unknown if currently breastfeeding.  Physical Exam:  General: alert, cooperative and no distress Lochia:normal flow Chest: CTAB Heart: RRR no m/r/g Abdomen: +BS, soft, nontender, dsg c/d/intact, no erythema Uterine Fundus: firm DVT Evaluation: No evidence of DVT seen on physical exam. Extremities: no edema  Recent Labs    10/23/21 0601 10/25/21 0527  HGB 11.4* 9.1*  HCT 32.4* 26.8*    Assessment/Plan: Plan for discharge tomorrow and Lactation consult Anemia of pregnancy/blood loss:  start oral iron   LOS: 2 days   10/27/21 10/25/2021, 7:05 AM

## 2021-10-26 DIAGNOSIS — O411290 Chorioamnionitis, unspecified trimester, not applicable or unspecified: Secondary | ICD-10-CM | POA: Diagnosis not present

## 2021-10-26 MED ORDER — OXYCODONE HCL 5 MG PO TABS
5.0000 mg | ORAL_TABLET | ORAL | Status: DC | PRN
  Administered 2021-10-26 (×3): 5 mg via ORAL
  Filled 2021-10-26 (×3): qty 1

## 2021-10-26 MED ORDER — IBUPROFEN 600 MG PO TABS: 600.0000 mg | ORAL_TABLET | Freq: Four times a day (QID) | ORAL | 0 refills | Status: AC | PRN

## 2021-10-26 MED ORDER — OXYCODONE HCL 5 MG PO TABS: 5.0000 mg | ORAL_TABLET | ORAL | 0 refills | Status: AC | PRN

## 2021-10-26 NOTE — Lactation Note (Signed)
This note was copied from a baby's chart. Lactation Consultation Note  Patient Name: Girl Ardys Hataway TOIZT'I Date: 10/26/2021 Reason for consult: Follow-up assessment Age:26 hours  Family denies questions or concerns. Reviewed engorgement care and monitoring voids/stools.  Feeding Mother's Current Feeding Choice: Breast Milk and Formula      Discharge Discharge Education: Engorgement and breast care;Warning signs for feeding baby  Consult Status Consult Status: Complete Date: 10/26/21    Dahlia Byes [facility] 10/26/2021, 2:44 PM

## 2021-10-28 LAB — SURGICAL PATHOLOGY

## 2021-11-04 ENCOUNTER — Ambulatory Visit (INDEPENDENT_AMBULATORY_CARE_PROVIDER_SITE_OTHER): Payer: 59 | Admitting: Advanced Practice Midwife

## 2021-11-04 ENCOUNTER — Encounter: Payer: Self-pay | Admitting: Advanced Practice Midwife

## 2021-11-04 VITALS — BP 112/74 | HR 75 | Ht 62.0 in | Wt 148.0 lb

## 2021-11-04 DIAGNOSIS — Z09 Encounter for follow-up examination after completed treatment for conditions other than malignant neoplasm: Secondary | ICD-10-CM

## 2021-11-04 NOTE — Progress Notes (Signed)
  HPI: Patient returns for routine postoperative follow-up having undergone PLTCS on 10/24/21.  The patient's immediate postoperative recovery has been unremarkable. Since hospital discharge the patient reports no c/o.  Feels well.   Current Outpatient Medications: Prenatal Vit-Fe Fumarate-FA (PRENATAL VITAMIN PO), Take by mouth., Disp: , Rfl:  ferrous sulfate 325 (65 FE) MG tablet, Take 1 tablet (325 mg total) by mouth every other day. (Patient not taking: Reported on 11/04/2021), Disp: 45 tablet, Rfl: 2 ibuprofen (ADVIL) 600 MG tablet, Take 1 tablet (600 mg total) by mouth every 6 (six) hours as needed. (Patient not taking: Reported on 11/04/2021), Disp: 30 tablet, Rfl: 0 oxyCODONE (OXY IR/ROXICODONE) 5 MG immediate release tablet, Take 1 tablet (5 mg total) by mouth every 4 (four) hours as needed for moderate pain. (Patient not taking: Reported on 11/04/2021), Disp: 30 tablet, Rfl: 0  No current facility-administered medications for this visit.    Blood pressure 112/74, pulse 75, height 5\' 2"  (1.575 m), weight 148 lb (67.1 kg), currently breastfeeding.  Physical Exam: Incision C/D w/o erythema, drainage or odor. none    Impression:  1.5 weeks s/p CS, incision looks good Trying to decide b/t paragard and POPs, leaning towards paragard, but would like PCOS benefits, but worried about SE (had lots of nausea w/COCs)   Plan: No orders of the defined types were placed in this encounter.    Follow up: No follow-ups on file.   , CNM

## 2021-12-04 ENCOUNTER — Ambulatory Visit: Payer: Self-pay | Admitting: Advanced Practice Midwife

## 2021-12-26 ENCOUNTER — Ambulatory Visit: Payer: Self-pay | Admitting: Advanced Practice Midwife

## 2022-01-01 ENCOUNTER — Other Ambulatory Visit (HOSPITAL_COMMUNITY)
Admission: RE | Admit: 2022-01-01 | Discharge: 2022-01-01 | Disposition: A | Discharge status: Home / Self Care | Payer: BLUE CROSS/BLUE SHIELD | Source: Ambulatory Visit | Attending: Advanced Practice Midwife | Admitting: Advanced Practice Midwife

## 2022-01-01 ENCOUNTER — Ambulatory Visit (INDEPENDENT_AMBULATORY_CARE_PROVIDER_SITE_OTHER): Payer: BLUE CROSS/BLUE SHIELD | Admitting: Advanced Practice Midwife

## 2022-01-01 ENCOUNTER — Encounter: Payer: Self-pay | Admitting: Advanced Practice Midwife

## 2022-01-01 VITALS — BP 108/68 | HR 67 | Ht 62.0 in | Wt 142.4 lb

## 2022-01-01 DIAGNOSIS — B3731 Acute candidiasis of vulva and vagina: Secondary | ICD-10-CM | POA: Insufficient documentation

## 2022-01-01 DIAGNOSIS — N898 Other specified noninflammatory disorders of vagina: Secondary | ICD-10-CM

## 2022-01-01 DIAGNOSIS — F53 Postpartum depression: Secondary | ICD-10-CM | POA: Diagnosis not present

## 2022-01-01 DIAGNOSIS — Z3490 Encounter for supervision of normal pregnancy, unspecified, unspecified trimester: Secondary | ICD-10-CM | POA: Insufficient documentation

## 2022-01-01 DIAGNOSIS — N760 Acute vaginitis: Secondary | ICD-10-CM | POA: Insufficient documentation

## 2022-01-01 DIAGNOSIS — Z3201 Encounter for pregnancy test, result positive: Secondary | ICD-10-CM | POA: Diagnosis not present

## 2022-01-01 DIAGNOSIS — M25551 Pain in right hip: Secondary | ICD-10-CM | POA: Insufficient documentation

## 2022-01-01 LAB — POCT URINE PREGNANCY: Preg Test, Ur: POSITIVE — AB

## 2022-01-01 NOTE — Patient Instructions (Signed)
Here's a reference to help with postpartum depression: www.postpartum.net

## 2022-01-01 NOTE — Progress Notes (Signed)
POSTPARTUM VISIT Patient name: Mackenzie Elliott MRN [id number]  Date of birth: Jan 21, 1996 Chief Complaint:   Follow-up (4 wks)  History of Present Illness:   Mackenzie Elliott is a 26 y.o. G53P1001 Caucasian female being seen today for a postpartum visit. She is 10 weeks postpartum following a primary cesarean section, low transverse incision for failure to progress past 6.5cm at 39.5 gestational weeks. IOL: yes, for PROM. Anesthesia:  epidural converted to general .  Laceration: n/a.  Complications: Triple I. Inpatient contraception: no.   Pregnancy uncomplicated. Tobacco use: no. Substance use disorder: no. Last pap smear: Jan 2023 and results were NILM w/ HRHPV negative. Next pap smear due: Jan 2026 No LMP recorded.  Postpartum course has been complicated by relational concerns/anxiety; R hip pain; vaginal irritation and discharge . Bleeding none. Bowel function is  normal; has hemorrhoid . Bladder function is normal. Urinary incontinence? no, fecal incontinence? no Patient is sexually active. Last sexual activity:  approx 10x since delivery . Desired contraception:  n/a . Patient does not know about a pregnancy in the future.  Desired family size is unsure number of children.   Upstream - 01/01/22 1346       Pregnancy Intention Screening   Does the patient want to become pregnant in the next year? No    Does the patient's partner want to become pregnant in the next year? No    Would the patient like to discuss contraceptive options today? Yes      Contraception Wrap Up   Current Method Female Condom    End Method Female Condom    Contraception Counseling Provided Yes            The pregnancy intention screening data noted above was reviewed. Potential methods of contraception were discussed. The patient elected to proceed with Female Condom.  Edinburgh Postpartum Depression Screening: positive: H/O mental health disorder: no. Currently on meds: no.  Currently in therapy: no.  Sleeping:  difficulty.  Appetite: nl.  Still finds joy in things she used to: Yes.  Support at home: yes.  SI/HI/II: no.  Interested in medicine: No.  Interested in therapy: Yes.  Edinburgh Postnatal Depression Scale - 01/01/22 1336       Edinburgh Postnatal Depression Scale:  In the Past 7 Days   I have been able to laugh and see the funny side of things. 1    I have looked forward with enjoyment to things. 1    I have blamed myself unnecessarily when things went wrong. 2    I have been anxious or worried for no good reason. 2    I have felt scared or panicky for no good reason. 2    Things have been getting on top of me. 2    I have been so unhappy that I have had difficulty sleeping. 1    I have felt sad or miserable. 3    I have been so unhappy that I have been crying. 2    The thought of harming myself has occurred to me. 0    Edinburgh Postnatal Depression Scale Total 16                08/06/2021    9:04 AM 04/23/2021   11:02 AM  GAD 7 : Generalized Anxiety Score  Nervous, Anxious, on Edge 2 2  Control/stop worrying 1 2  Worry too much - different things 2 2  Trouble relaxing 2 2  Restless 0 0  Easily  annoyed or irritable 2 2  Afraid - awful might happen 1 0  Total GAD 7 Score 10 10     Baby's course has been uncomplicated. Baby is feeding by bottle. Infant has a pediatrician/family doctor? Yes.  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  Pt has material needs met for her and baby: Yes.   Review of Systems:   Pertinent items are noted in HPI Denies Abnormal vaginal discharge w/ itching/odor/irritation, headaches, visual changes, shortness of breath, chest pain, abdominal pain, severe nausea/vomiting, or problems with urination or bowel movements. Pertinent History Reviewed:  Reviewed past medical,surgical, obstetrical and family history.  Reviewed problem list, medications and allergies. OB History  Gravida Para Term Preterm AB Living  _0 SAB IAB Ectopic Multiple Live Births        0 1    # Outcome Date GA Lbr Len/2nd Weight Sex Delivery Anes PTL Lv  1 Term 10/24/21 [redacted]w[redacted]d 8 lb 1 oz (3.657 kg) F CS-LTranv EPI, Gen  LIV     Birth Comments: Grossly normal term female infant   Physical Assessment:   Vitals:   01/01/22 1333  BP: 108/68  Pulse: 67  Weight: 142 lb 6.4 oz (64.6 kg)  Height: _1  (1.575 m)  Body mass index is 26.05 kg/m.       Physical Examination:   General appearance: alert, well appearing, and in no distress  Mental status: alert, oriented to person, place, and time  Skin: warm & dry   Cardiovascular: normal heart rate noted   Respiratory: normal respiratory effort, no distress   Breasts: deferred, no complaints   Abdomen: soft, non-tender   Pelvic: examination not indicated. Thin prep pap obtained: No  Rectal: not examined  Extremities: Edema: none         Results for orders placed or performed in visit on 01/01/22 (from the past 24 hour(s))  POCT urine pregnancy   Collection Time: 01/01/22  2:01 PM  Result Value Ref Range   Preg Test, Ur Positive (A) Negative    Assessment & Plan:  1) Postpartum exam 2) Ten wks s/p primary cesarean section, low transverse incision 3) bottle feeding 4) Depression screening: mod positive; feels is situational; will refer to [facility]5) Contraception: n/a 6) R hip pain/weakness; epidural wasn't functioning well on the R side, will refer to ortho for eval and treatment 7) Vaginal irritation/discharge/pain w sex; CV swab collected 8) Early pregnant; unsure dating as hasn't had cycle  Essential components of care per ACOG recommendations:  1.  Mood and well being:  If positive depression screen, discussed and plan developed.  If using tobacco we discussed reduction/cessation and risk of relapse If current substance abuse, we discussed and referral to local resources was offered.   2. Infant care and feeding:  If breastfeeding, discussed returning to work,  pumping, breastfeeding-associated pain, guidance regarding return to fertility while lactating if not using another method. If needed, patient was provided with a letter to be allowed to pump q 2-3hrs to support lactation in a private location with access to a refrigerator to store breastmilk.   Recommended that all caregivers be immunized for flu, pertussis and other preventable communicable diseases If pt does not have material needs met for her/baby, referred to local resources for help obtaining these.  3. Sexuality, contraception and birth spacing Provided guidance regarding sexuality, management of dyspareunia, and resumption of intercourse Discussed avoiding interpregnancy interval <  70mhs and recommended birth spacing of 18 months  4. Sleep and fatigue Discussed coping options for fatigue and sleep disruption Encouraged family/partner/community support of 4 hrs of uninterrupted sleep to help with mood and fatigue  5. Physical recovery  If pt had a C/S, assessed incisional pain and providing guidance on normal vs prolonged recovery If pt had a laceration, perineal healing and pain reviewed.  If urinary or fecal incontinence, discussed management and referred to PT or uro/gyn if indicated  Patient is safe to resume physical activity. Discussed attainment of healthy weight.  6.  Chronic disease management Discussed pregnancy complications if any, and their implications for future childbearing and long-term maternal health. Review recommendations for prevention of recurrent pregnancy complications, such as 17 hydroxyprogesterone caproate to reduce risk for recurrent PTB not applicable, or aspirin to reduce risk of preeclampsia not applicable. Pt had GDM: no. If yes, 2hr GTT scheduled: not applicable. Reviewed medications and non-pregnant dosing including consideration of whether pt is breastfeeding using a reliable resource such as LactMed: not applicable Referred for f/u w/ PCP or  subspecialist providers as indicated: ortho referral for hip pain; [facility]  7. Health maintenance Mammogram at [id number] earlier if indicated Pap smears as indicated  Meds: No orders of the defined types were placed in this encounter.   Follow-up: Return for Dating u/s 2 weeks.   Orders Placed This Encounter  Procedures   AMB referral to orthopedics   Ambulatory referral to [facility]owl[facility]urine pregnancy    KMyrtis SerCWashburn Surgery Center LLC10/06/2021 2:36 PM

## 2022-01-01 NOTE — Addendum Note (Signed)
Addended by: Diona Fanti A on: 01/01/2022 02:57 PM   Modules accepted: Orders

## 2022-01-03 ENCOUNTER — Other Ambulatory Visit: Payer: Self-pay | Admitting: Advanced Practice Midwife

## 2022-01-03 DIAGNOSIS — B9689 Other specified bacterial agents as the cause of diseases classified elsewhere: Secondary | ICD-10-CM

## 2022-01-03 DIAGNOSIS — B3731 Acute candidiasis of vulva and vagina: Secondary | ICD-10-CM

## 2022-01-03 LAB — CERVICOVAGINAL ANCILLARY ONLY
Bacterial Vaginitis (gardnerella): POSITIVE — AB
Candida Glabrata: NEGATIVE
Candida Vaginitis: POSITIVE — AB
Chlamydia: NEGATIVE
Comment: NEGATIVE
Comment: NEGATIVE
Comment: NEGATIVE
Comment: NEGATIVE
Comment: NEGATIVE
Comment: NORMAL
Neisseria Gonorrhea: NEGATIVE
Trichomonas: NEGATIVE

## 2022-01-03 MED ORDER — TERCONAZOLE 0.4 % VA CREA: 1.0000 | TOPICAL_CREAM | Freq: Every day | VAGINAL | 0 refills | Status: AC

## 2022-01-03 MED ORDER — METRONIDAZOLE 500 MG PO TABS: 500.0000 mg | ORAL_TABLET | Freq: Two times a day (BID) | ORAL | 0 refills | Status: AC

## 2022-01-20 ENCOUNTER — Other Ambulatory Visit: Payer: Self-pay | Admitting: Advanced Practice Midwife

## 2022-01-20 DIAGNOSIS — O3680X0 Pregnancy with inconclusive fetal viability, not applicable or unspecified: Secondary | ICD-10-CM

## 2022-01-21 ENCOUNTER — Ambulatory Visit: Payer: BLUE CROSS/BLUE SHIELD | Admitting: *Deleted

## 2022-01-21 ENCOUNTER — Other Ambulatory Visit: Payer: BLUE CROSS/BLUE SHIELD

## 2022-09-24 IMAGING — CT CT HEAD W/O CM
3 series · 16 of 47 positions shown, 19 images · non-contrast
Comparison: Brain MRI 03/09/2018.

CLINICAL DATA: 24-year-old female with anxiety, vomiting,
hyperventilation. Ingested 4 - 25 mg delta1 gummies.

EXAM:
CT HEAD WITHOUT CONTRAST
TECHNIQUE: Contiguous axial images were obtained from the base of the skull
through the vertex without intravenous contrast.

[Series 2: head wo · axial · 0.42mm/px · z∈[-120,+10]mm · 10 of 32 slices shown, 13 images]
[im 3/32  brain]
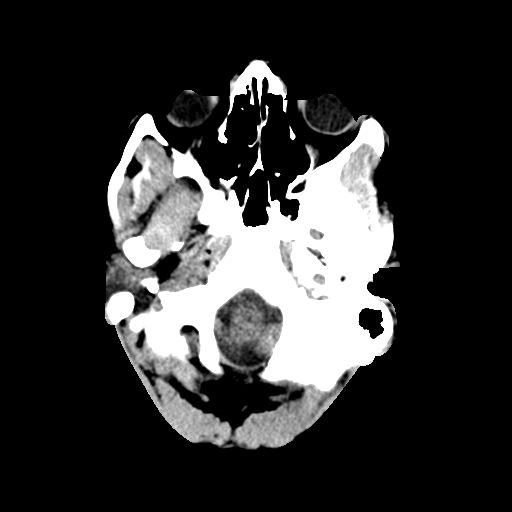
[im 3/32  bone]
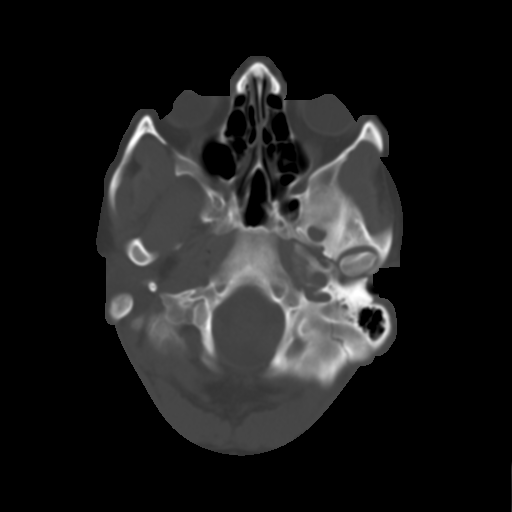
[im 6/32  brain]
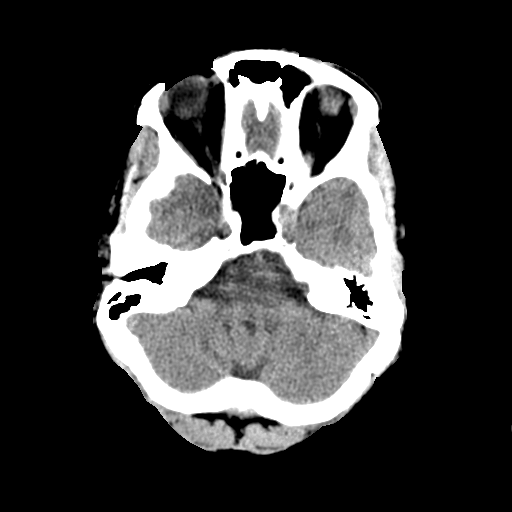
[im 9/32  brain]
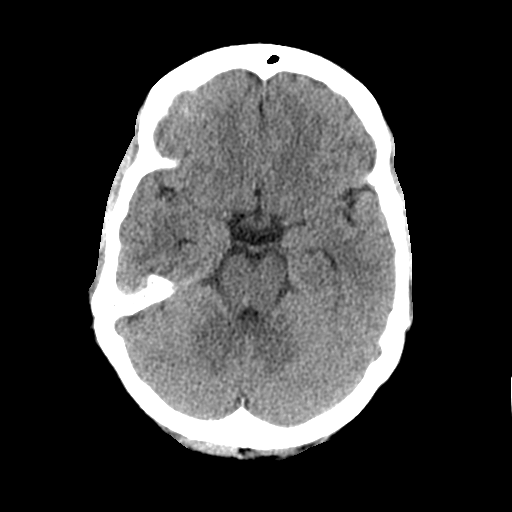
[im 11/32  brain]
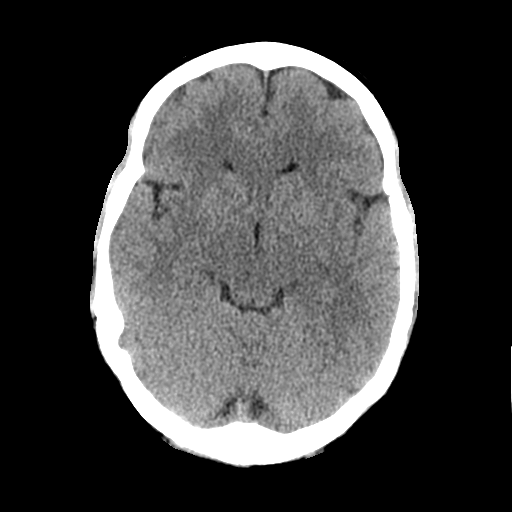
[im 14/32  brain]
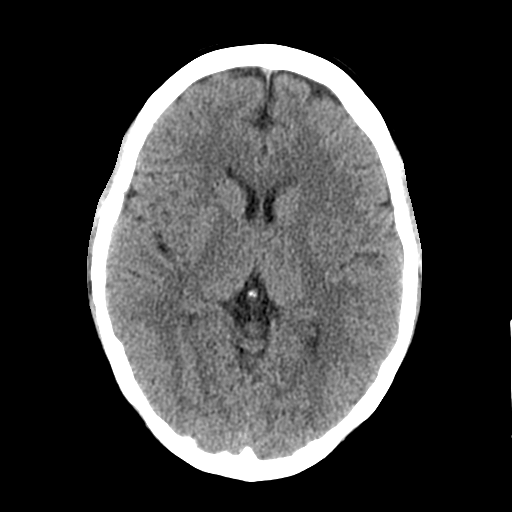
[im 14/32  bone]
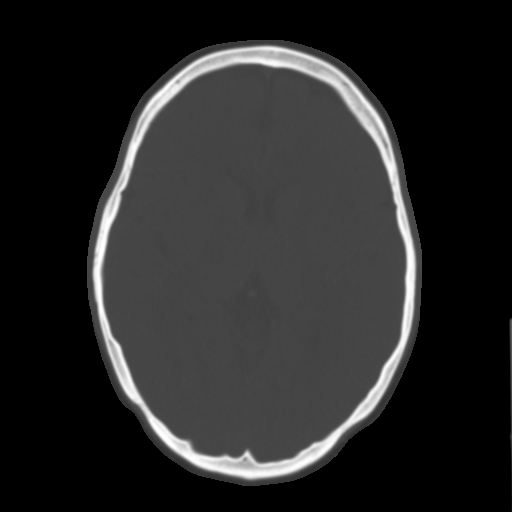
[im 18/32  brain]
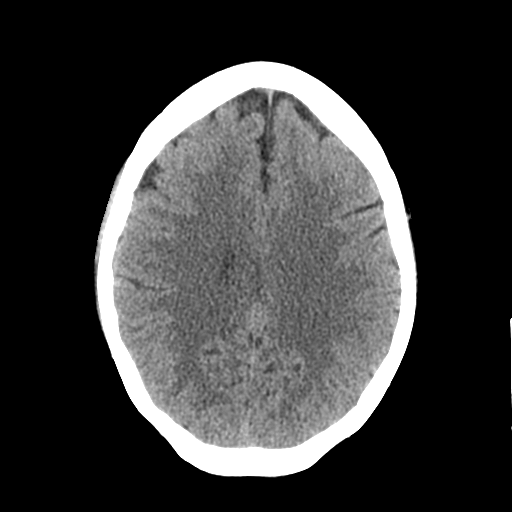
[im 21/32  brain]
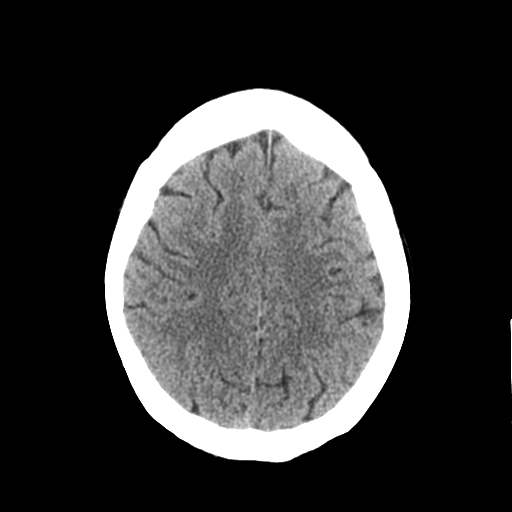
[im 24/32  brain]
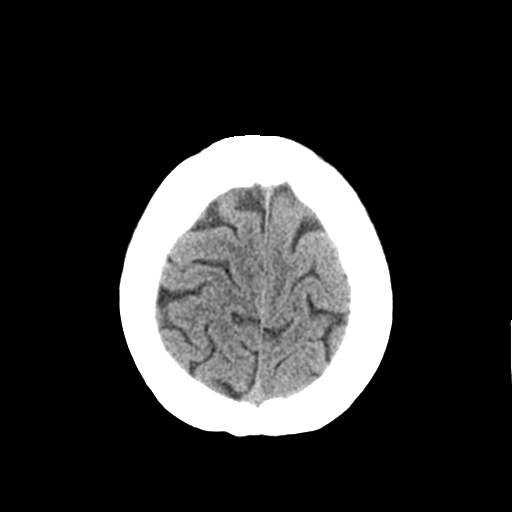
[im 26/32  brain]
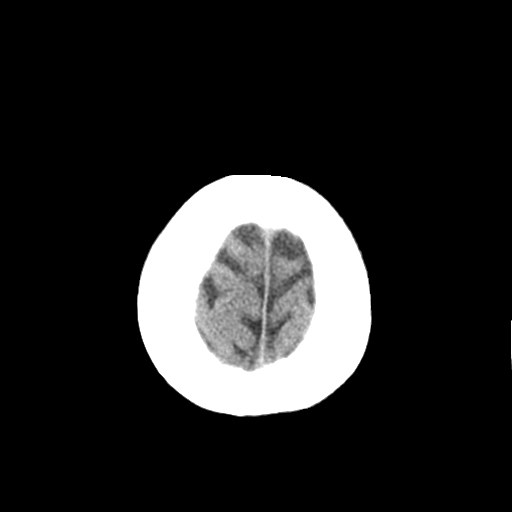
[im 26/32  bone]
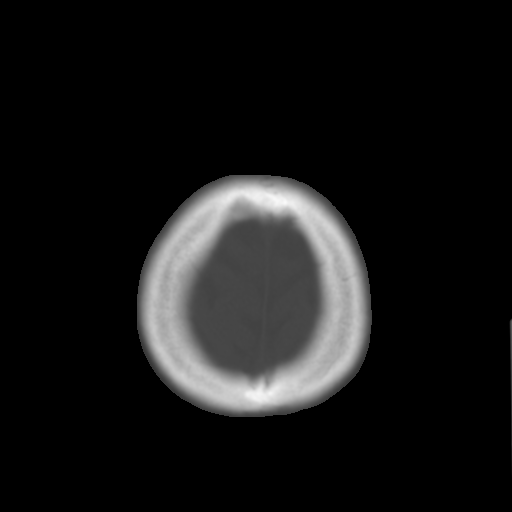
[im 29/32  brain]
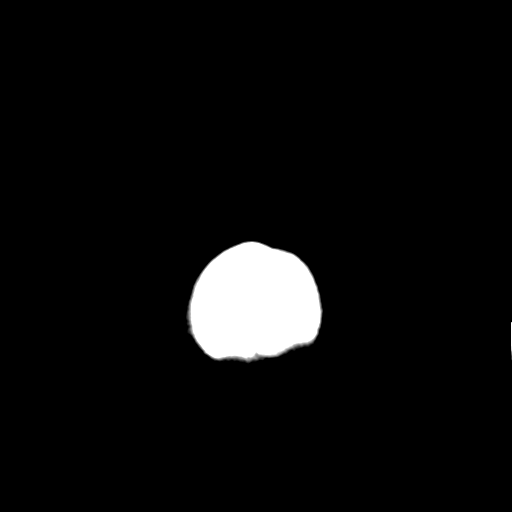

[Series 5: coronal soft tissue · coronal · 0.32mm/px · 3 of 72 slices shown]
[im 24/72  brain]
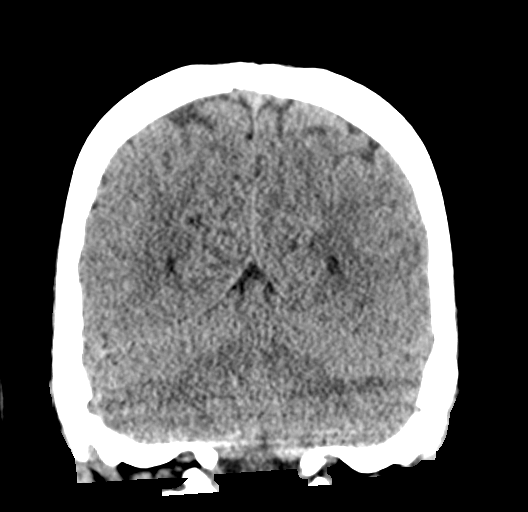
[im 32/72  brain]
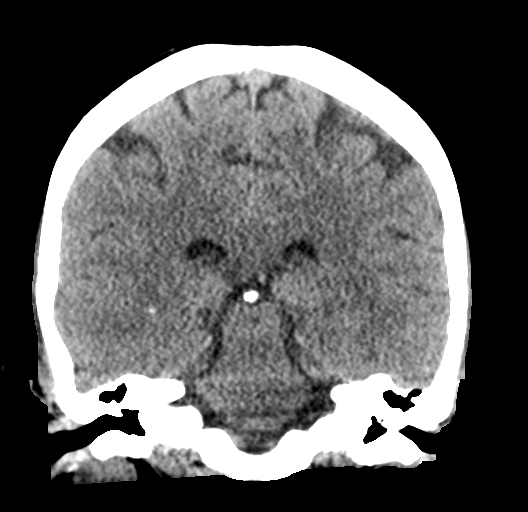
[im 40/72  brain]
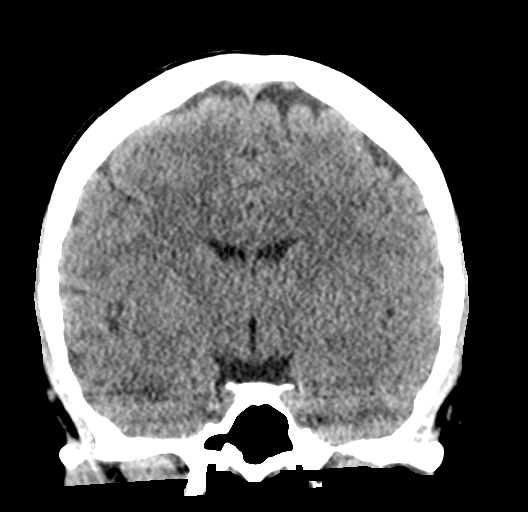

[Series 6: sagittal soft tissue · sagittal · 0.32mm/px · 3 of 57 slices shown]
[im 19/57  brain]
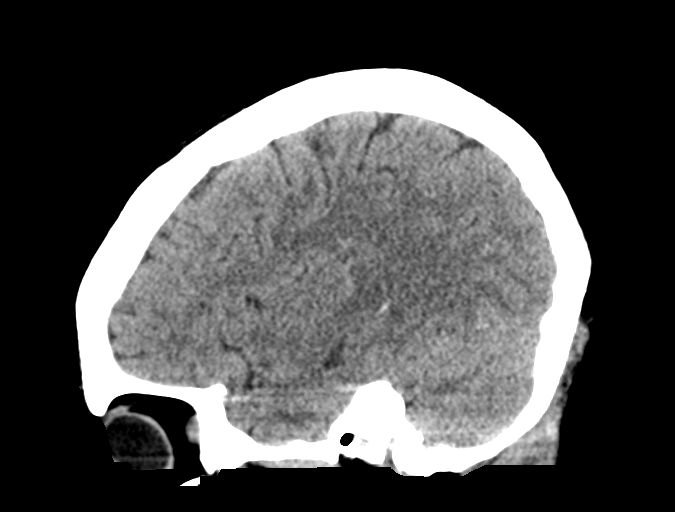
[im 29/57  brain]
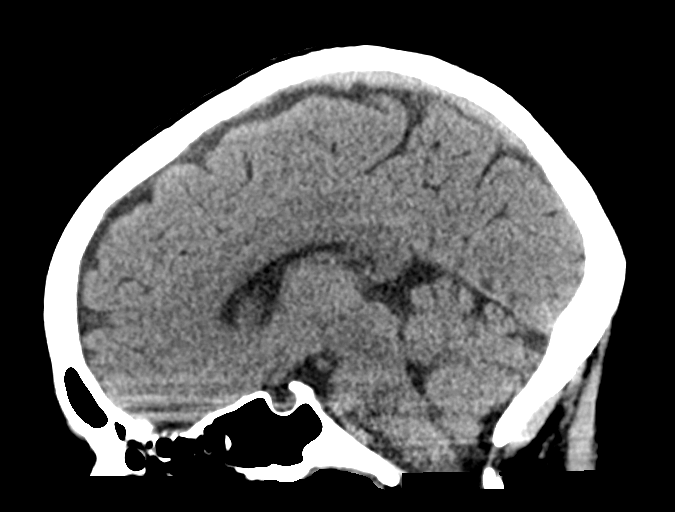
[im 38/57  brain]
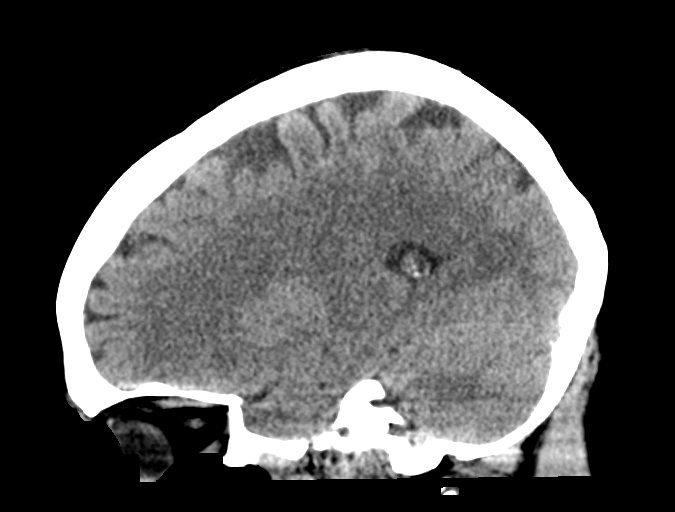

[16 of 47 positions shown; findings below may reference images not displayed]

FINDINGS: Brain: Cerebral volume remains normal. No midline shift,
ventriculomegaly, mass effect, evidence of mass lesion, intracranial
hemorrhage or evidence of cortically based acute infarction.
Gray-white matter differentiation is within normal limits throughout
the brain.

Vascular: No suspicious intracranial vascular hyperdensity.

Skull: Negative.

Sinuses/Orbits: Visualized paranasal sinuses and mastoids are clear.

Other: Orbit and scalp soft tissues are within normal limits.
IMPRESSION: Normal noncontrast Head CT.

## 2022-09-25 ENCOUNTER — Other Ambulatory Visit: Payer: Self-pay | Admitting: Advanced Practice Midwife

## 2022-09-25 DIAGNOSIS — B3731 Acute candidiasis of vulva and vagina: Secondary | ICD-10-CM
